# Patient Record
Sex: Female | Born: 1980 | Race: White | Hispanic: No | Smoking: Former smoker
Health system: Southern US, Community
[De-identification: ages and names within clinical notes are randomized; demographics above are authoritative.]

## PROBLEM LIST (undated history)

## (undated) DIAGNOSIS — M419 Scoliosis, unspecified: Secondary | ICD-10-CM

## (undated) DIAGNOSIS — M629 Disorder of muscle, unspecified: Secondary | ICD-10-CM

## (undated) DIAGNOSIS — I10 Essential (primary) hypertension: Secondary | ICD-10-CM

## (undated) DIAGNOSIS — F32A Depression, unspecified: Secondary | ICD-10-CM

## (undated) DIAGNOSIS — F4310 Post-traumatic stress disorder, unspecified: Secondary | ICD-10-CM

## (undated) DIAGNOSIS — Q780 Osteogenesis imperfecta: Secondary | ICD-10-CM

## (undated) DIAGNOSIS — M797 Fibromyalgia: Secondary | ICD-10-CM

## (undated) DIAGNOSIS — F419 Anxiety disorder, unspecified: Secondary | ICD-10-CM

## (undated) DIAGNOSIS — F329 Major depressive disorder, single episode, unspecified: Secondary | ICD-10-CM

## (undated) HISTORY — DX: Osteogenesis imperfecta: Q78.0

## (undated) HISTORY — PX: KNEE SURGERY: SHX244

## (undated) HISTORY — DX: Fibromyalgia: M79.7

## (undated) HISTORY — DX: Depression, unspecified: F32.A

## (undated) HISTORY — DX: Anxiety disorder, unspecified: F41.9

## (undated) HISTORY — DX: Major depressive disorder, single episode, unspecified: F32.9

## (undated) HISTORY — PX: NOSE SURGERY: SHX723

## (undated) HISTORY — PX: CHOLECYSTECTOMY: SHX55

## (undated) HISTORY — DX: Essential (primary) hypertension: I10

## (undated) HISTORY — DX: Scoliosis, unspecified: M41.9

## (undated) HISTORY — DX: Disorder of muscle, unspecified: M62.9

## (undated) HISTORY — DX: Post-traumatic stress disorder, unspecified: F43.10

## (undated) HISTORY — PX: NASAL SINUS SURGERY: SHX719

---

## 2003-07-06 ENCOUNTER — Other Ambulatory Visit: Payer: Self-pay

## 2005-09-04 ENCOUNTER — Observation Stay: Payer: Self-pay

## 2005-09-23 ENCOUNTER — Observation Stay: Payer: Self-pay

## 2005-10-05 ENCOUNTER — Observation Stay: Payer: Self-pay

## 2005-10-06 ENCOUNTER — Ambulatory Visit: Payer: Self-pay

## 2005-10-27 ENCOUNTER — Observation Stay: Payer: Self-pay

## 2005-11-12 ENCOUNTER — Observation Stay: Payer: Self-pay

## 2005-11-29 ENCOUNTER — Observation Stay: Payer: Self-pay | Admitting: Obstetrics and Gynecology

## 2005-12-08 ENCOUNTER — Inpatient Hospital Stay: Payer: Self-pay | Admitting: Unknown Physician Specialty

## 2006-12-07 ENCOUNTER — Ambulatory Visit: Payer: Self-pay | Admitting: Obstetrics & Gynecology

## 2006-12-14 ENCOUNTER — Ambulatory Visit: Payer: Self-pay | Admitting: Obstetrics & Gynecology

## 2010-02-05 ENCOUNTER — Emergency Department: Payer: Self-pay | Admitting: Emergency Medicine

## 2010-02-13 HISTORY — PX: ABDOMINAL HYSTERECTOMY: SHX81

## 2010-02-25 ENCOUNTER — Ambulatory Visit: Payer: Self-pay | Admitting: Obstetrics & Gynecology

## 2010-03-04 ENCOUNTER — Ambulatory Visit: Payer: Self-pay | Admitting: Obstetrics & Gynecology

## 2010-03-06 LAB — PATHOLOGY REPORT

## 2010-06-29 ENCOUNTER — Emergency Department: Payer: Self-pay | Admitting: Emergency Medicine

## 2010-11-06 ENCOUNTER — Emergency Department: Payer: Self-pay | Admitting: Emergency Medicine

## 2012-10-14 IMAGING — US US PELV - US TRANSVAGINAL
1 series · 17 of 25 positions shown · non-contrast
Comparison: none

REASON FOR EXAM: Pelvic pain for 1 [DATE] month with DUB
COMMENTS:   LMP: > one month ago

[Series 1: us pelv - us transvaginal · 17 of 71 slices shown]
[im 1/71]
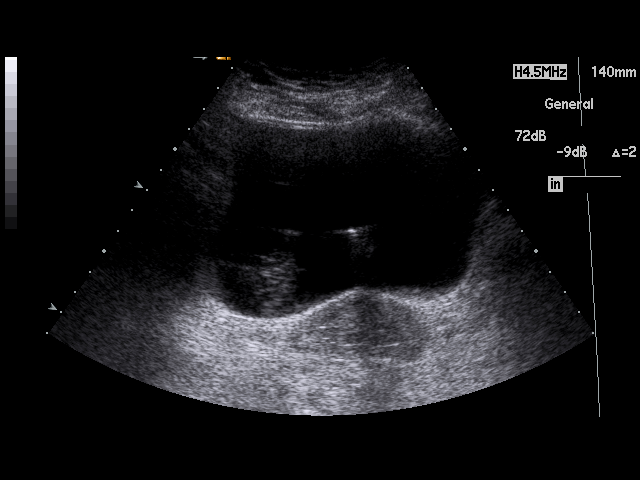
[im 6/71]
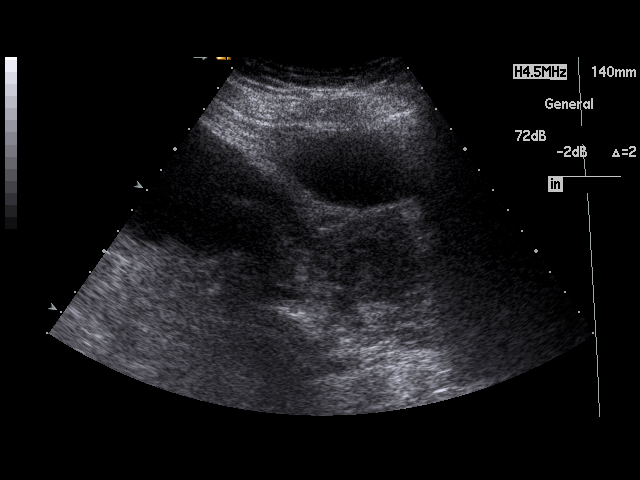
[im 9/71]
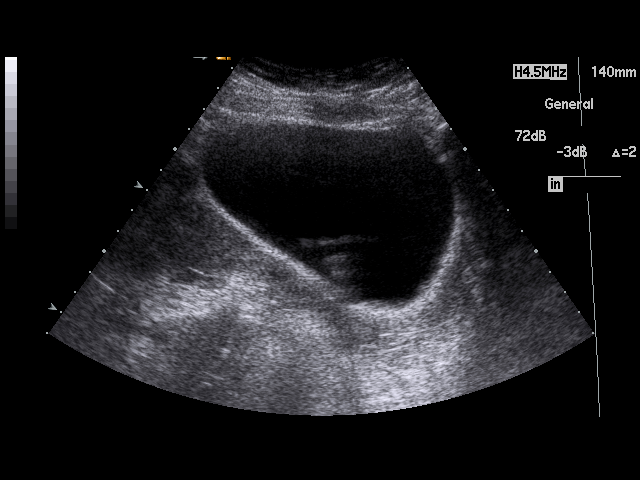
[im 15/71]
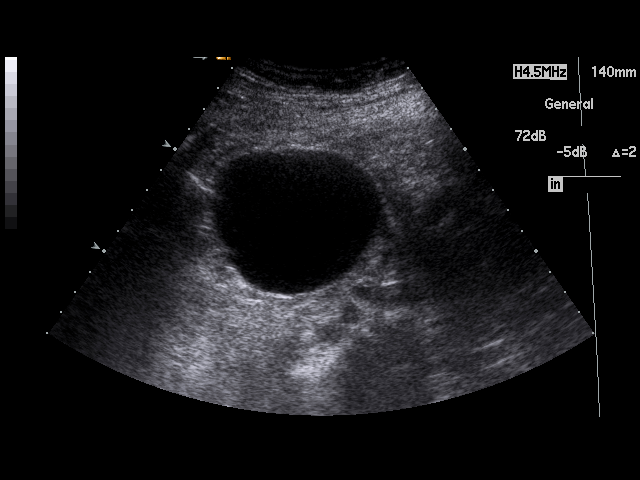
[im 18/71]
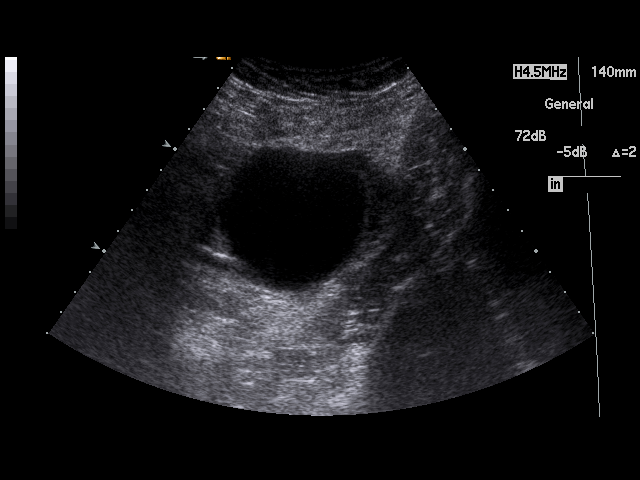
[im 24/71]
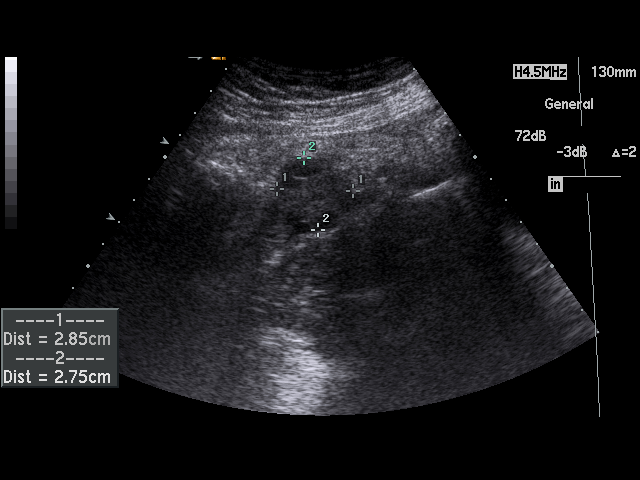
[im 27/71]
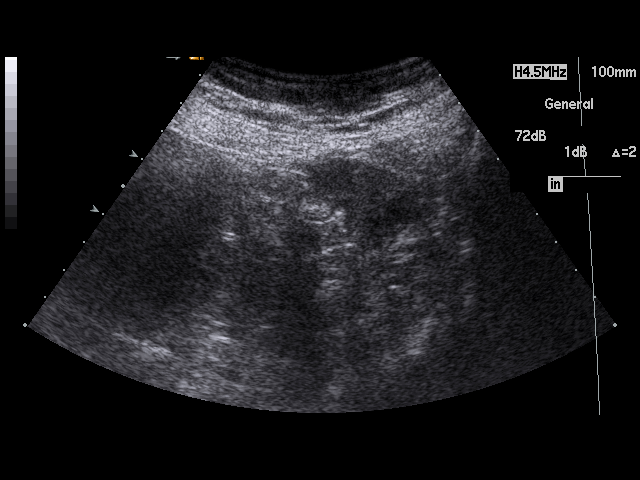
[im 33/71]
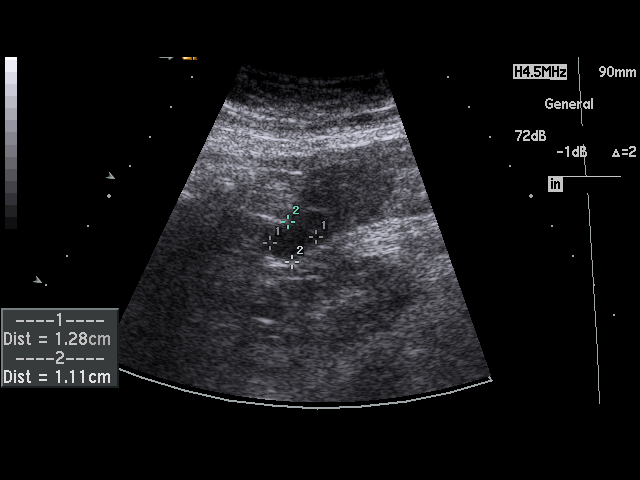
[im 36/71]
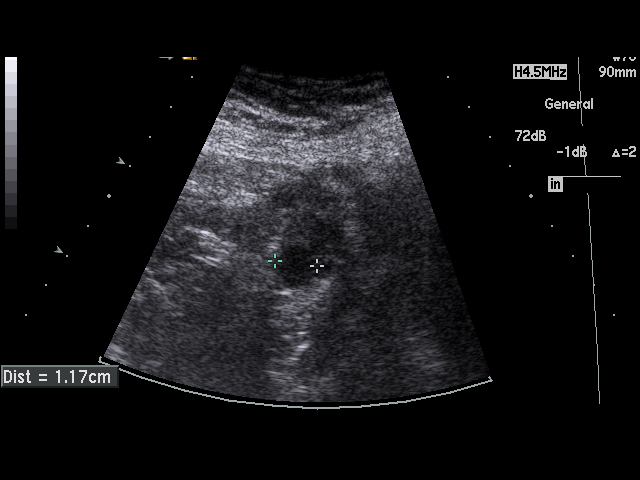
[im 38/71]
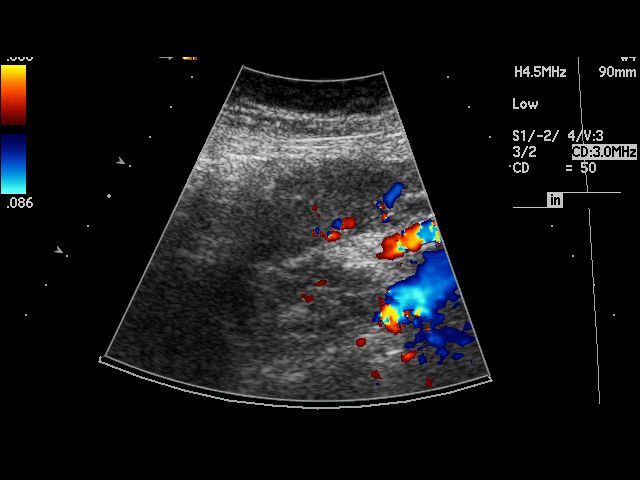
[im 44/71]
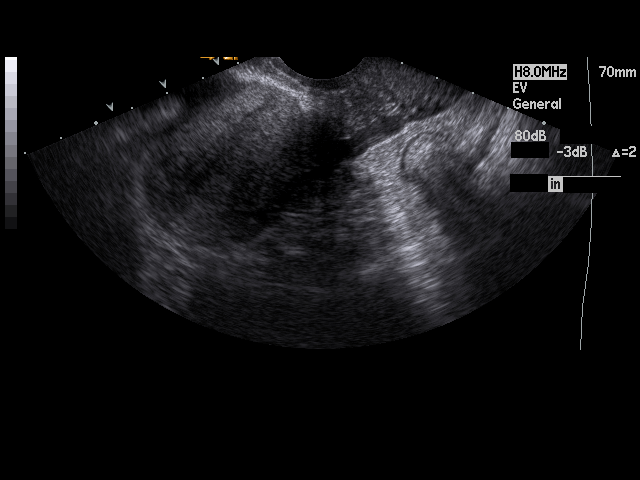
[im 47/71]
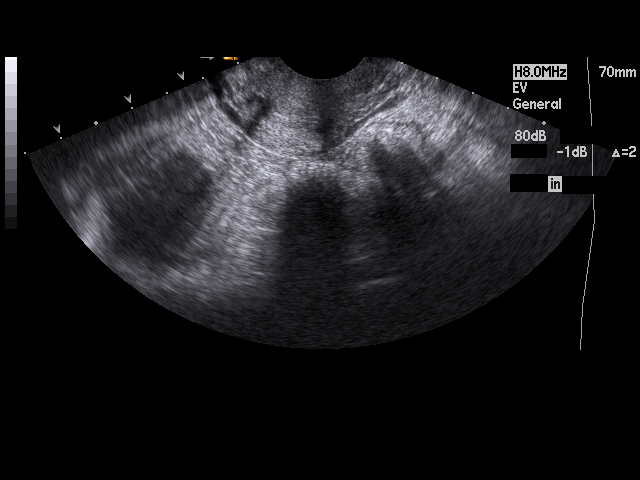
[im 53/71]
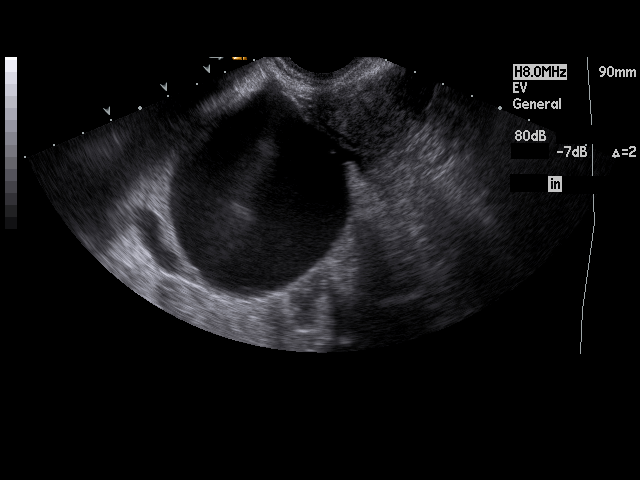
[im 56/71]
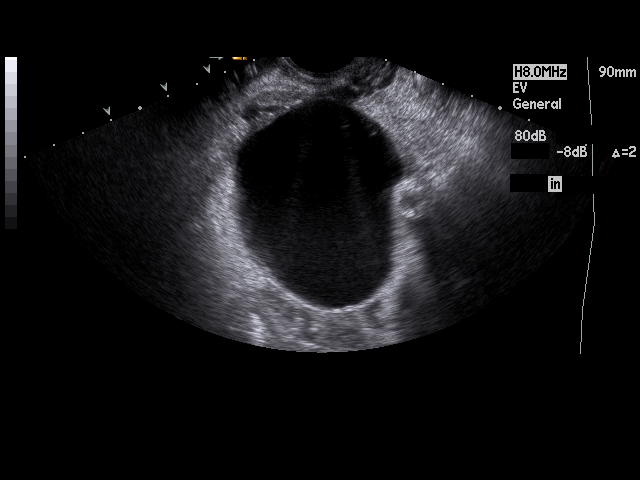
[im 62/71]
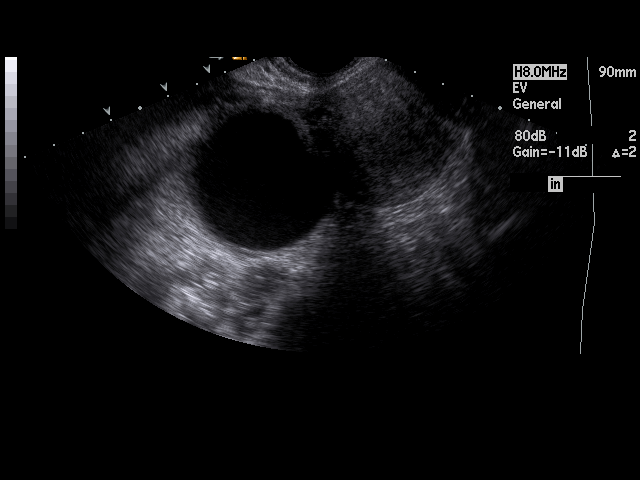
[im 65/71]
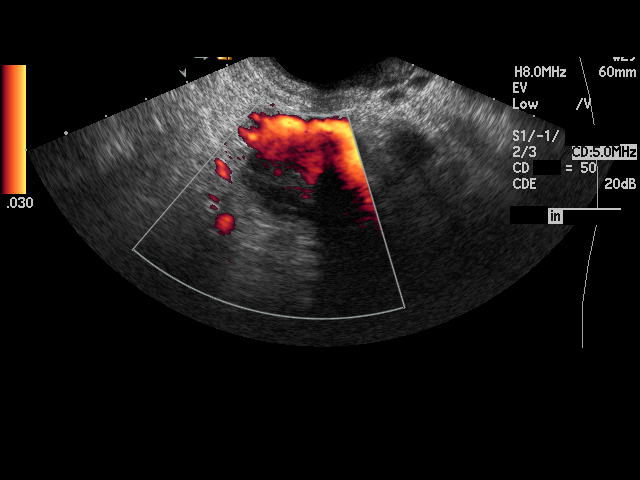
[im 71/71]
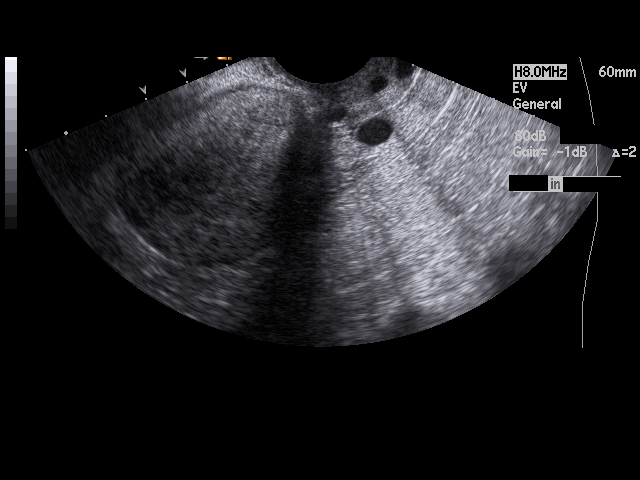

[17 of 25 positions shown; findings below may reference images not displayed]

PROCEDURE:     US  - US PELVIS EXAM W/TRANSVAGINAL  - February 05, 2010  [DATE]

RESULT:     Transabdominal and endovaginal ultrasound was performed. The
uterus measures 11.16 cm x 4.74 cm x 3.59 cm. No uterine mass lesions are
seen. The endometrium measures 5 mm in thickness. The right and left ovaries
are visualized. The right ovary measures 2.85 cm at maximum diameter and the
left ovary measures 4.1 cm at maximum diameter. There is a 6.88 cm exophytic
cyst of the right ovary. Multiple follicular cysts are seen in the left
ovary. No ascites is observed. The kidneys show no hydronephrosis.
IMPRESSION: 1. There is a 6.88 cm cyst of the right ovary.
2. No free fluid is seen in the pelvis.
3. No uterine mass lesions are noted.

## 2013-05-23 ENCOUNTER — Ambulatory Visit: Payer: Self-pay | Admitting: Otolaryngology

## 2013-07-15 IMAGING — CT CT HEAD WITHOUT CONTRAST
2 series · 16 of 30 positions shown, 20 images · non-contrast
Comparison: none

REASON FOR EXAM: left side h/a with right sided blurred vision today
Flex 5
COMMENTS:   LMP: Post Hysterectomy

PROCEDURE:     CT  - CT HEAD WITHOUT CONTRAST  - November 06, 2010  [DATE]
RESULT:     Comparison:  None
TECHNIQUE: Multiple axial images from the foramen magnum to the vertex were
obtained without IV contrast.

[Series 2: without · axial · non-contrast · 0.41mm/px · z∈[+29,+149]mm · 13 of 30 slices shown, 17 images]
[im 3/30  brain]
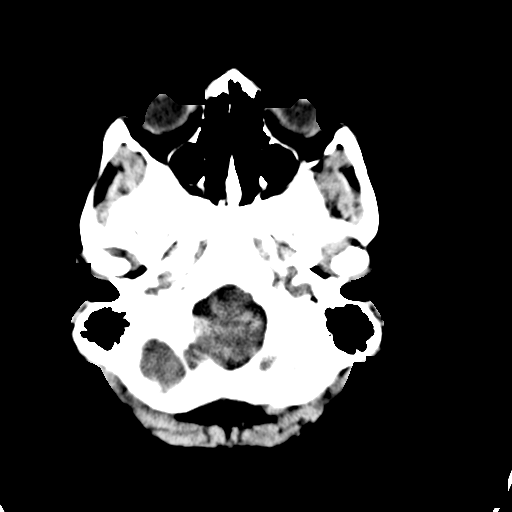
[im 3/30  bone]
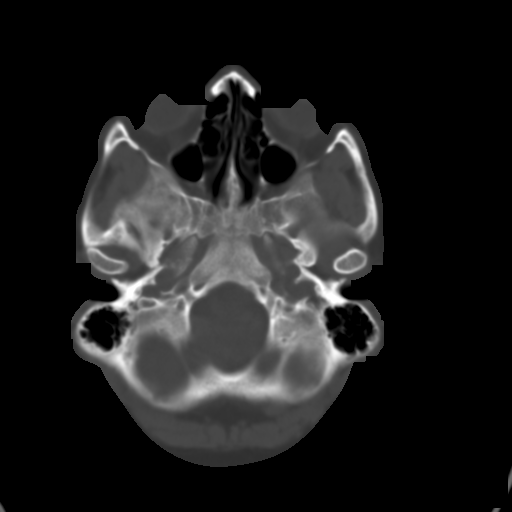
[im 5/30  brain]
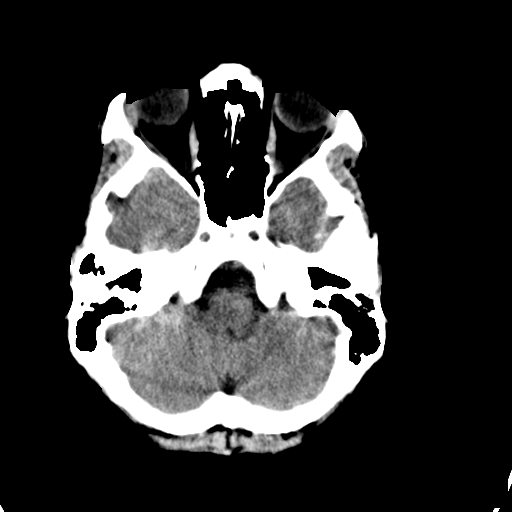
[im 7/30  brain]
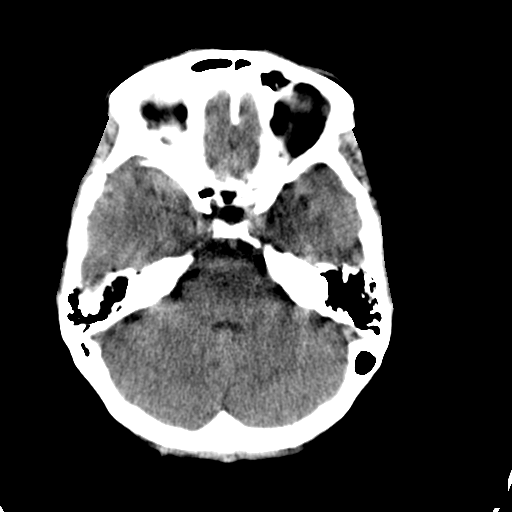
[im 9/30  brain]
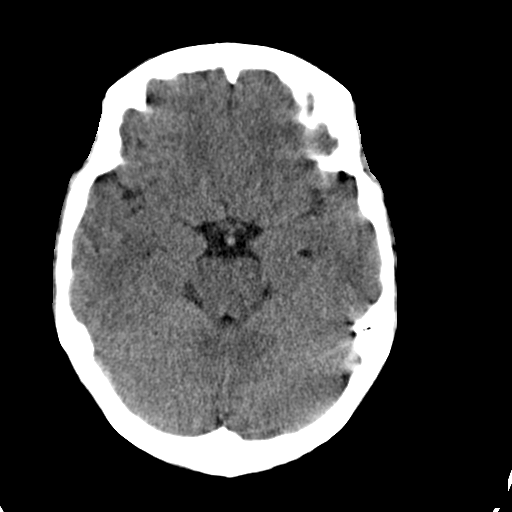
[im 11/30  brain]
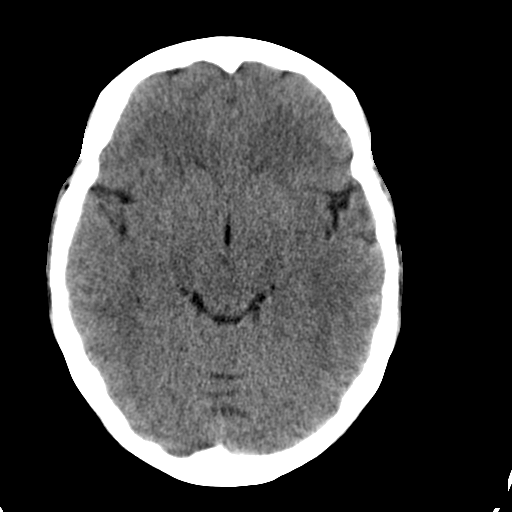
[im 11/30  bone]
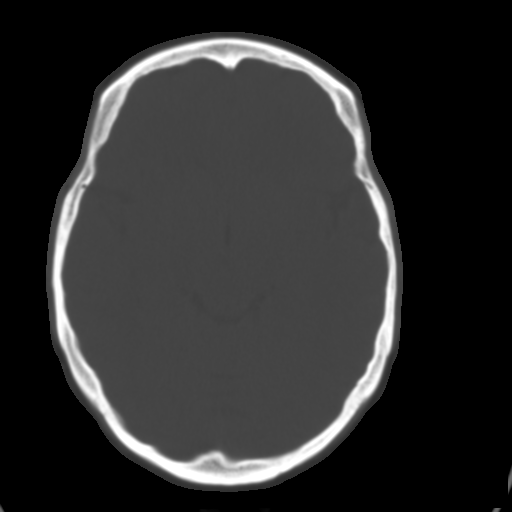
[im 13/30  brain]
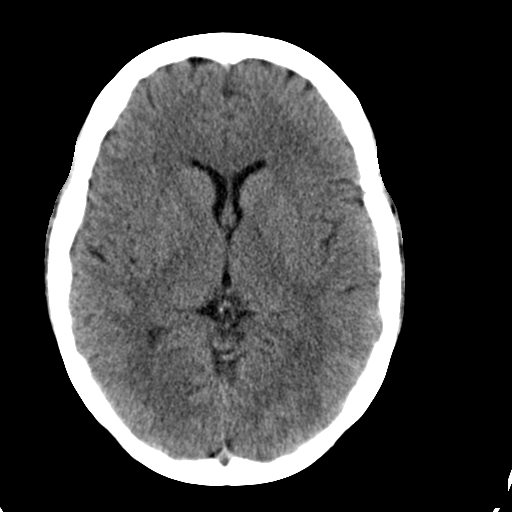
[im 15/30  brain]
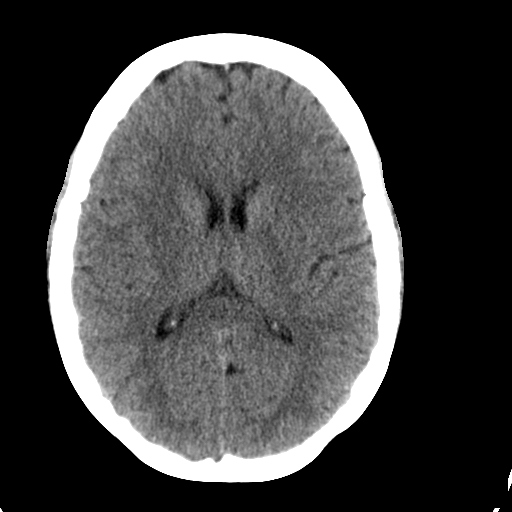
[im 17/30  brain]
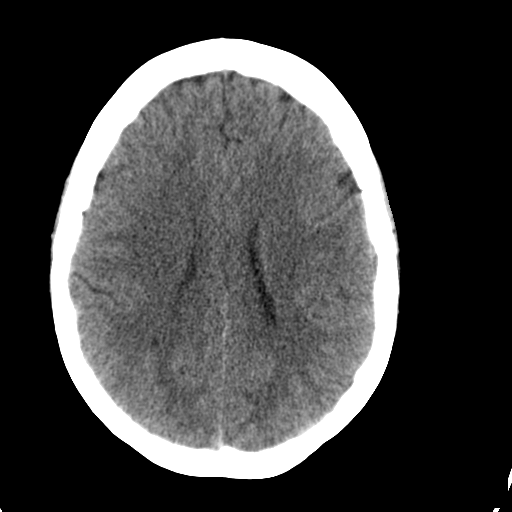
[im 19/30  brain]
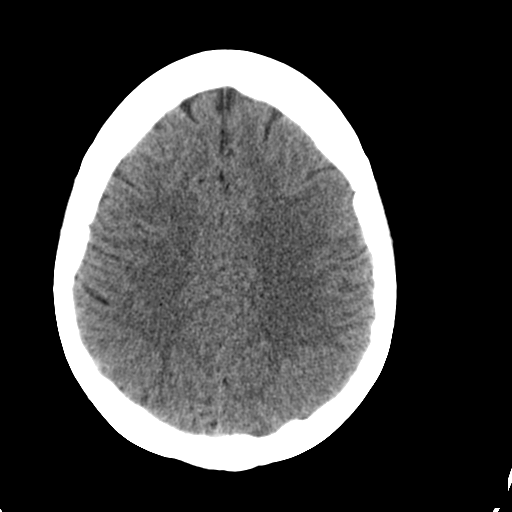
[im 19/30  bone]
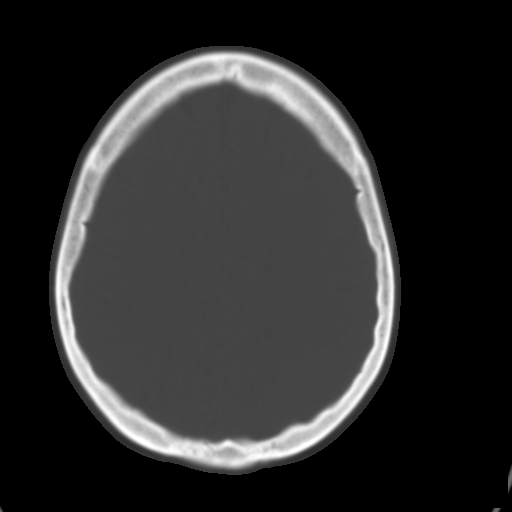
[im 21/30  brain]
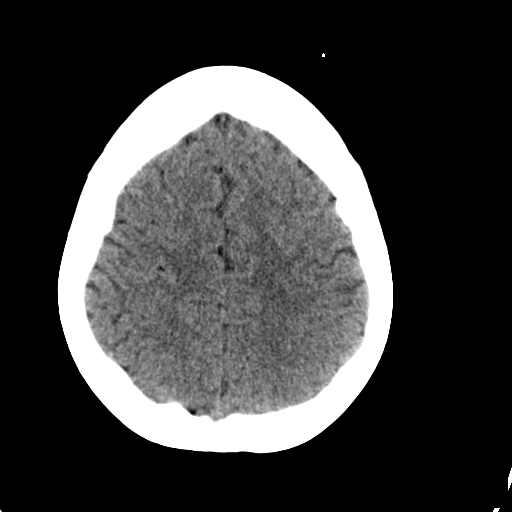
[im 23/30  brain]
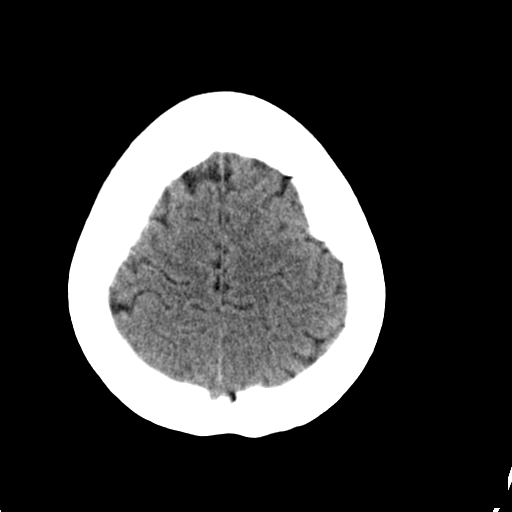
[im 25/30  brain]
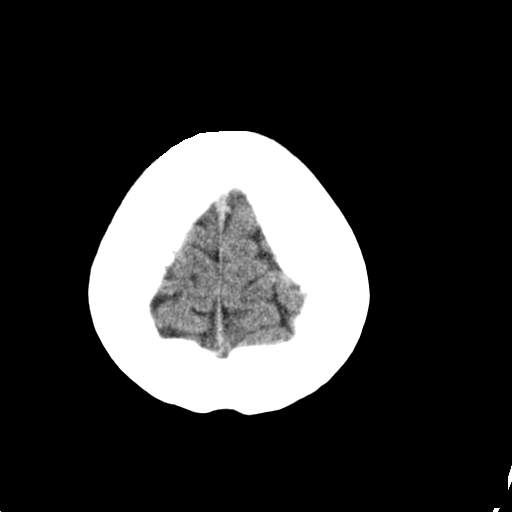
[im 27/30  brain]
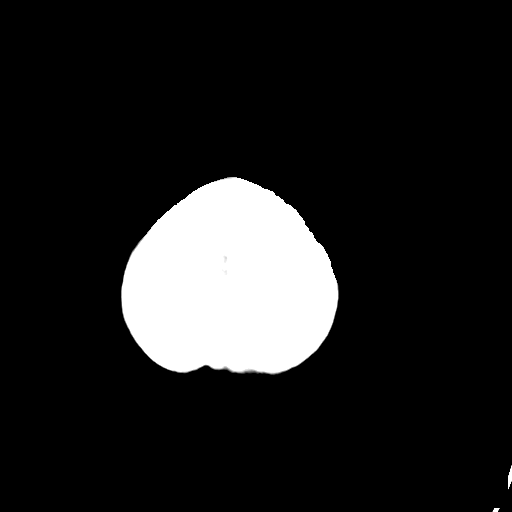
[im 27/30  bone]
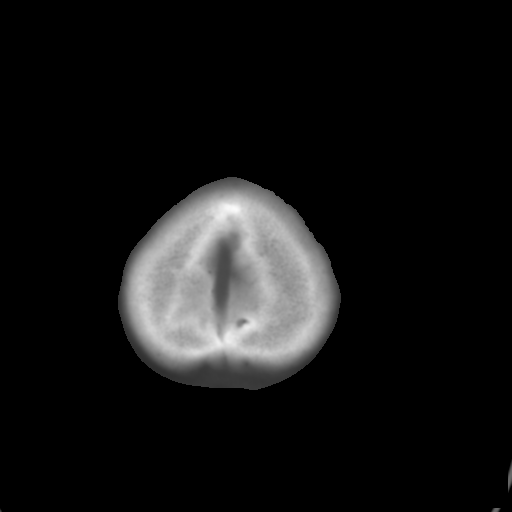

[Series 3: bone · axial · 0.41mm/px · z∈[+29,+69]mm · 3 of 30 slices shown]
[im 3/30  bone]
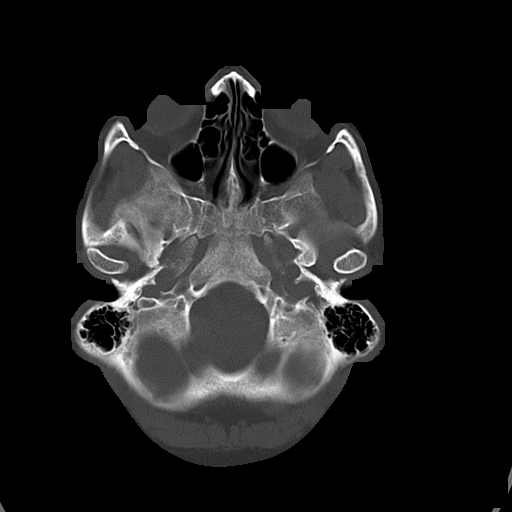
[im 7/30  bone]
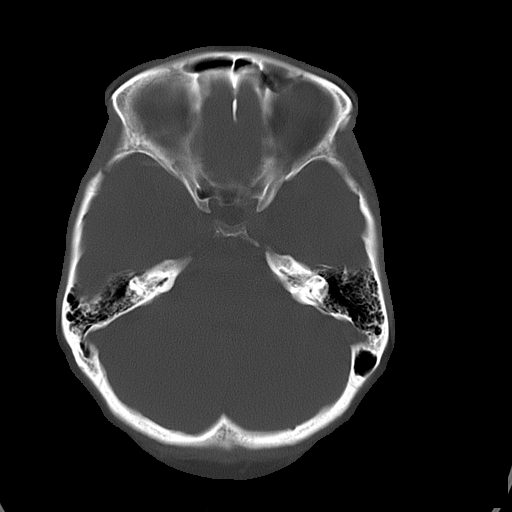
[im 11/30  bone]
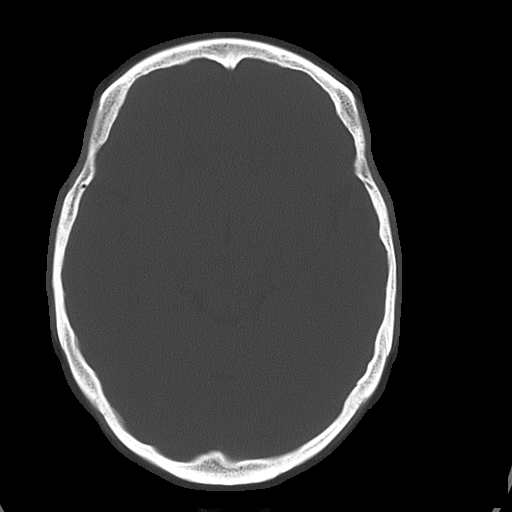

[16 of 30 positions shown; findings below may reference images not displayed]

FINDINGS: There is no evidence of mass effect, midline shift, or extra-axial fluid
collections.  There is no evidence of a space-occupying lesion or
intracranial hemorrhage. There is no evidence of a cortical-based area of
acute infarction.

The ventricles and sulci are appropriate for the patient's age. The basal
cisterns are patent.

Visualized portions of the orbits are unremarkable. The visualized portions
of the paranasal sinuses and mastoid air cells are unremarkable.

The osseous structures are unremarkable.
IMPRESSION: No acute intracranial process.

## 2014-07-07 NOTE — Op Note (Signed)
PATIENT NAME:  Rebecca Farrell, Rebecca Farrell MR#:  [id number] DATE OF BIRTH:  [birth year]  DATE OF PROCEDURE:  05/23/2013  PREOPERATIVE DIAGNOSES:  1.  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  2.  Chronic left maxillary sinusitis.   POSTOPERATIVE DIAGNOSES:  1.  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  2.  Chronic left maxillary sinusitis.   PROCEDURES: 1.  Left endoscopic maxillary antrostomy with tissue removal.  2.  Right endoscopic concha bullosa resection.  3.  Nasal septoplasty.  4.  Bilateral submucous resection of the inferior turbinates.   SURGEON: Marion DownerScott Tilden Broz, MD   ANESTHESIA: General endotracheal.   INDICATIONS: The patient with a history of chronic nasal obstruction as well as chronic left maxillary sinusitis.   FINDINGS: There was mucosal thickening and mucoid drainage coming from the infundibulum of the left maxillary sinus. The turbinates were substantially hypertrophic and there was a right concha bullosa obstructing the right nasal cavity. The nasal septum deviated to the left.   COMPLICATIONS: None.   DESCRIPTION OF PROCEDURE: After obtaining informed consent, the patient was taken to the operating room and placed in the supine position. After induction of general endotracheal anesthesia, the patient was turned 90 degrees. The nose was decongested with Afrin moistened pledgets. 1% lidocaine with epinephrine 1:100,000 was injected into either side of the nasal septum, the inferior turbinates, and the middle meatus bilaterally. She was then prepped and draped in the usual sterile fashion. The left nasal cavity was inspected endoscopically. The middle turbinate was medialized and the uncinate process was subsequently resected using a combination of through-cutting forceps, the pediatric backbiter, and the microdebrider. Soft tissue and bone were removed to create a large patent maxillary  antrostomy joining up the primary os with the secondary os. Attention was then turned to the right side. A sickle knife was used to incise the concha bullosa. The lateral wall of the concha bullosa was then resected with endoscopic scissors. The medial wall of the concha bullosa that remained was barely attached to the remainder of the middle turbinate and it was felt best to go ahead and resect that residual medial wall. Next, the nasal septum was incised on the left along its caudal aspect and the mucoperichondrial flap was elevated on the left side of the nasal septum. This was elevated back over the bony cartilaginous junction. The bony septum was noted to be deviated towards the left. This was separated from the cartilaginous septum and the deviated bony septum resected with Knight scissors. This allowed the cartilaginous septum to relax back into the midline straightening the nasal septum nicely. The incision was closed with 4-0 chromic gut suture. The mucoperichondrial flap was reapproximated to the underlying cartilage with a 4-0 plain gut suture in a running quilting type stitch. Next, the left inferior turbinate was incised along its caudal border with a 15 blade. The mucoperiosteum was elevated off of the medial aspect of the turbinate. A portion of the concha and lateral mucoperiosteum was then resected with Knight scissors. The cut edge of the turbinate was cauterized with suction cautery to control bleeding. The same procedure was then performed on the right inferior turbinate. The nose was suctioned to remove any blood clot. Stammberger absorbable sinus packing was placed in the middle meatus bilaterally to control minor bleeding. Next, septal splints were placed on either side and secured with a 4-0 Ethilon suture. The patient was then returned to the anesthesiologist for awakening. She  was awakened and taken to the recovery room in good condition postoperatively. Blood loss was less than 50  mL. ____________________________ Ollen Gross. Willeen Cass, MD psb:sb D: 05/23/2013 11:53:54 ET T: 05/23/2013 12:05:06 ET JOB#: [id number]  cc: Ollen Gross. Willeen Cass, MD, <Dictator> Sandi Mealy MD ELECTRONICALLY SIGNED 05/24/2013 13:48

## 2017-01-29 ENCOUNTER — Encounter: Payer: Self-pay | Admitting: Obstetrics and Gynecology

## 2017-01-29 ENCOUNTER — Ambulatory Visit (INDEPENDENT_AMBULATORY_CARE_PROVIDER_SITE_OTHER): Payer: Medicare Other | Admitting: Obstetrics and Gynecology

## 2017-01-29 VITALS — BP 128/86 | HR 92 | Ht 61.0 in | Wt 158.0 lb

## 2017-01-29 DIAGNOSIS — Z0000 Encounter for general adult medical examination without abnormal findings: Secondary | ICD-10-CM | POA: Diagnosis not present

## 2017-01-29 DIAGNOSIS — Z90711 Acquired absence of uterus with remaining cervical stump: Secondary | ICD-10-CM

## 2017-01-29 DIAGNOSIS — N760 Acute vaginitis: Secondary | ICD-10-CM

## 2017-01-29 DIAGNOSIS — R8290 Unspecified abnormal findings in urine: Secondary | ICD-10-CM

## 2017-01-29 LAB — POCT URINALYSIS DIPSTICK
BILIRUBIN UA: NEGATIVE
Glucose, UA: NEGATIVE
KETONES UA: NEGATIVE
Leukocytes, UA: NEGATIVE
NITRITE UA: NEGATIVE
PH UA: 6 (ref 5.0–8.0)
PROTEIN UA: NEGATIVE
RBC UA: NEGATIVE
Spec Grav, UA: 1.025 (ref 1.010–1.025)
Urobilinogen, UA: 0.2 E.U./dL

## 2017-01-30 ENCOUNTER — Encounter: Payer: Self-pay | Admitting: Obstetrics and Gynecology

## 2017-01-30 DIAGNOSIS — Z90711 Acquired absence of uterus with remaining cervical stump: Secondary | ICD-10-CM | POA: Insufficient documentation

## 2017-01-30 MED ORDER — METRONIDAZOLE 500 MG PO TABS: 500.0000 mg | ORAL_TABLET | Freq: Two times a day (BID) | ORAL | 0 refills | Status: AC

## 2017-01-30 NOTE — Progress Notes (Signed)
Chief Complaint: Abnormal vaginal discharge  HPI:      Ms. Rebecca Farrell is a 36 y.o. [id number] who LMP was No LMP recorded (exact date). Patient has had a hysterectomy., presents today for a problem visit.  She complains of:  Vaginitis: Patient complains of an abnormal vaginal discharge for 5 week. Vaginal symptoms include discharge described as white, malodorous and milky, local irritation and odor.Vulvar symptoms include local irritation.STI Risk: Very low risk of STD exposureDischarge described as: copious, white and watery.Other associated symptoms: none.  Patient also had questions regarding the possibility of her and her new partner having a child. She is engaged and her fiance is interested in having a child with her. She had a super cervical hysterectomy several years ago, but retains her ovaries.   Patient was not aware that her cervix remained, it was seen on exam. She has not had cervical cancer screening in several years. Performed today.  PMHx: She  has a past medical history of Anxiety, Depression, Disorder of skeletal muscle, Fibromyalgia, Hypertension, Osteogenesis imperfecta, PTSD (post-traumatic stress disorder), and Scoliosis. Also,  has a past surgical history that includes Abdominal hysterectomy (02/2010); Knee surgery (Bilateral); Cesarean section (x2); Nose surgery; Nasal sinus surgery; and Cholecystectomy., family history includes Endometriosis in her maternal aunt; Other in her daughter, father, and son.,  reports that she has been smoking cigarettes.  she has never used smokeless tobacco. She reports that she does not drink alcohol or use drugs.  She has a current medication list which includes the following prescription(s): diazepam, duloxetine, fluticasone, lisinopril-hydrochlorothiazide, meloxicam, methocarbamol, omeprazole, and metronidazole. Also, has No Known Allergies.  Review of Systems  Constitutional: Negative for chills, fever, malaise/fatigue and weight  loss.  HENT: Negative for congestion, hearing loss and sinus pain.   Eyes: Negative for blurred vision and double vision.  Respiratory: Negative for cough, sputum production, shortness of breath and wheezing.   Cardiovascular: Negative for chest pain, palpitations, orthopnea and leg swelling.  Gastrointestinal: Negative for abdominal pain, constipation, diarrhea, nausea and vomiting.  Genitourinary: Negative for dysuria, flank pain, frequency, hematuria and urgency.  Musculoskeletal: Negative for back pain, falls and joint pain.  Skin: Negative for itching and rash.  Neurological: Negative for dizziness and headaches.  Psychiatric/Behavioral: Negative for depression, substance abuse and suicidal ideas. The patient is not nervous/anxious.     Objective: BP 128/86   Pulse 92   Ht 5\' 1"  (1.549 m)   Wt 158 lb (71.7 kg)   LMP  (Exact Date)   BMI 29.85 kg/m  Physical Exam  Constitutional: She is oriented to person, place, and time. She appears well-developed.  Genitourinary: There is no lesion on the right labia. There is no lesion on the left labia. Vagina exhibits no lesion. There is tenderness in the vagina. No erythema or bleeding in the vagina. No foreign body in the vagina. Vaginal discharge found. Right adnexum does not display mass. Left adnexum does not display mass. Cervix does not exhibit motion tenderness or lesion.  Genitourinary Comments: Uterus is surgically absent. Cervix is still present.   HENT:  Head: Normocephalic and atraumatic.  Eyes: EOM are normal.  Neck: Neck supple. No thyromegaly present.  Cardiovascular: Normal rate, regular rhythm and normal heart sounds.  Pulmonary/Chest: Effort normal and breath sounds normal. Right breast exhibits no inverted nipple, no mass, no nipple discharge and no skin change. Left breast exhibits no inverted nipple, no mass, no nipple discharge and no skin change.  Abdominal: Soft. Bowel sounds  are normal. She exhibits no distension and  no mass.  Neurological: She is alert and oriented to person, place, and time.  Skin: Skin is warm and dry.  Psychiatric: She has a normal mood and affect. Her behavior is normal. Judgment and thought content normal.  Vitals reviewed.   ASSESSMENT/PLAN:    Problem List Items Addressed This Visit    None    Visit Diagnoses    Acute vaginitis    -  Primary   Relevant Orders   NuSwab Vaginitis Plus (VG+)   PapIG, CtNgTv, HPV, rfx 16/18   Bad odor of urine       Relevant Orders   POCT Urinalysis Dipstick (Completed)   Health care maintenance       Relevant Orders   PapIG, CtNgTv, HPV, rfx 16/18     Discussed with patient that in order for her to conceive a child with her partner they would need to use a surrogate. Did discuss with the patient that there are experimental uterine transplants, but that this was not a common practice procedure. Could refer the patient to [facility], but she does not want to pursue that at this time.    Rebecca Farrell

## 2017-02-02 LAB — NUSWAB VAGINITIS PLUS (VG+)
Atopobium vaginae: HIGH Score — AB
BVAB 2: HIGH Score — AB
CANDIDA ALBICANS, NAA: NEGATIVE
CHLAMYDIA TRACHOMATIS, NAA: NEGATIVE
Candida glabrata, NAA: NEGATIVE
MEGASPHAERA 1: HIGH {score} — AB
Neisseria gonorrhoeae, NAA: NEGATIVE
TRICH VAG BY NAA: NEGATIVE

## 2017-02-03 ENCOUNTER — Telehealth: Payer: Self-pay | Admitting: Obstetrics and Gynecology

## 2017-02-03 LAB — PAPIG, CTNGTV, HPV, RFX 16/18
CHLAMYDIA, NUC. ACID AMP: NEGATIVE
GONOCOCCUS, NUC. ACID AMP: NEGATIVE
HPV GENOTYPE, 16: POSITIVE — AB
HPV Genotype, 18: NEGATIVE
HPV, HIGH-RISK: POSITIVE — AB
PAP SMEAR COMMENT: 0
TRICH VAG BY NAA: NEGATIVE

## 2017-02-03 NOTE — Telephone Encounter (Signed)
Please advise, DR Jerene PitchSchuman not in office today

## 2017-02-03 NOTE — Telephone Encounter (Signed)
Pt is calling needing here Labs results. Please advise

## 2017-02-09 NOTE — Progress Notes (Signed)
Patient needs a colposcopy. Called and left message for her to return my call.

## 2017-02-10 ENCOUNTER — Other Ambulatory Visit: Payer: Self-pay | Admitting: Obstetrics and Gynecology

## 2017-02-10 ENCOUNTER — Telehealth: Payer: Self-pay | Admitting: Obstetrics and Gynecology

## 2017-02-10 DIAGNOSIS — B9689 Other specified bacterial agents as the cause of diseases classified elsewhere: Secondary | ICD-10-CM

## 2017-02-10 DIAGNOSIS — N760 Acute vaginitis: Principal | ICD-10-CM

## 2017-02-10 MED ORDER — CLINDAMYCIN PHOSPHATE 100 MG VA SUPP: 100.0000 mg | Freq: Every day | VAGINAL | 0 refills | Status: AC

## 2017-02-10 NOTE — Telephone Encounter (Signed)
Called and left voice mail for patient to call back to be schedule °

## 2017-02-10 NOTE — Telephone Encounter (Signed)
Pt is calling needing to speak with dr Jerene PitchSchuman about a prescription she was prescribed   and had an allergic reaction that has sent her to the hospital. Please call patient CB# [phone]

## 2017-02-10 NOTE — Telephone Encounter (Signed)
I called her and left a message. Will try again

## 2017-02-10 NOTE — Telephone Encounter (Signed)
-----   Message from Natale Milchhristanna R Schuman, MD sent at 02/10/2017 11:49 AM EST ----- Please call patient and schedule for colposcopy. Thank you.

## 2017-02-16 NOTE — Progress Notes (Signed)
Thank you Rosanne SackKasey!

## 2017-02-24 ENCOUNTER — Ambulatory Visit (INDEPENDENT_AMBULATORY_CARE_PROVIDER_SITE_OTHER): Payer: Medicare Other | Admitting: Obstetrics and Gynecology

## 2017-02-24 ENCOUNTER — Encounter: Payer: Self-pay | Admitting: Obstetrics and Gynecology

## 2017-02-24 VITALS — BP 132/84 | HR 97 | Ht 61.0 in | Wt 157.0 lb

## 2017-02-24 DIAGNOSIS — R87810 Cervical high risk human papillomavirus (HPV) DNA test positive: Secondary | ICD-10-CM

## 2017-02-24 DIAGNOSIS — N760 Acute vaginitis: Secondary | ICD-10-CM | POA: Diagnosis not present

## 2017-02-24 DIAGNOSIS — B9689 Other specified bacterial agents as the cause of diseases classified elsewhere: Secondary | ICD-10-CM | POA: Diagnosis not present

## 2017-02-24 MED ORDER — CLINDAMYCIN HCL 300 MG PO CAPS: 300.0000 mg | ORAL_CAPSULE | Freq: Two times a day (BID) | ORAL | 0 refills | Status: AC

## 2017-02-24 NOTE — Progress Notes (Signed)
Colposcopy Procedure Note  Indications: Pap smear 1 months ago showed: NIL, HPV 16 Positive. The prior pap was normal.  Prior cervical/vaginal disease: normal exam without visible pathology. Prior cervical treatment: no treatment.  Procedure Details  The risks and benefits of the procedure and Written informed consent obtained.  Speculum placed in vagina and excellent visualization of cervix achieved, cervix swabbed x 3 with acetic acid solution.  Findings: Cervix:Transformation zone not fully visualized. Small mosaic changes at 5'oclock and along lower cervical border. Visible lesion(s) at 5 o'clock; endocervical curettage performed, cervical biopsies taken at 5 o'clock, specimen labelled and sent to pathology and hemostasis achieved with silver nitrate. Vaginal inspection: vaginal colposcopy not performed. Vulvar colposcopy: vulvar colposcopy not performed.  Specimens: Ectocervical biopsy 5 o'clock and ECC  Complications: none.  Plan: Specimens labelled and sent to Pathology.  Bacterial Vaginosis: Flagyl allergy. Patient has not been able to fill her prescription for clindamycin ovules. Sent prescription for oral therapy.   -Dorinne Graeff MD 02/25/17 9:26 AM

## 2017-02-25 LAB — PATHOLOGY

## 2017-03-01 NOTE — Progress Notes (Signed)
Called and discussed result and plan with patient. She will need a repeat pap smear with cotesting in 1 year.

## 2019-02-07 ENCOUNTER — Other Ambulatory Visit: Payer: Self-pay

## 2019-02-07 DIAGNOSIS — Z20822 Contact with and (suspected) exposure to covid-19: Secondary | ICD-10-CM

## 2019-02-08 LAB — NOVEL CORONAVIRUS, NAA: SARS-CoV-2, NAA: NOT DETECTED

## 2019-02-13 ENCOUNTER — Telehealth: Payer: Medicare Other | Admitting: Physician Assistant

## 2019-02-13 DIAGNOSIS — J329 Chronic sinusitis, unspecified: Secondary | ICD-10-CM

## 2019-02-13 MED ORDER — AMOXICILLIN-POT CLAVULANATE 875-125 MG PO TABS: 1.0000 | ORAL_TABLET | Freq: Two times a day (BID) | ORAL | 0 refills | Status: DC

## 2019-02-13 NOTE — Progress Notes (Signed)

## 2021-06-23 ENCOUNTER — Encounter: Payer: Self-pay | Admitting: Otolaryngology

## 2021-06-27 NOTE — Discharge Instructions (Signed)
?[facility] ?[facility] ?ENDOSCOPIC SINUS SURGERY ?[facility] ? ?What is Functional Endoscopic Sinus Surgery? ? The Surgery involves making the natural openings of the sinuses larger by removing the bony partitions that separate the sinuses from the nasal cavity.  The natural sinus lining is preserved as much as possible to allow the sinuses to resume normal function after the surgery.  In some patients nasal polyps (excessively swollen lining of the sinuses) may be removed to relieve obstruction of the sinus openings.  The surgery is performed through the nose using lighted scopes, which eliminates the need for incisions on the face.  A septoplasty is a different procedure which is sometimes performed with sinus surgery.  It involves straightening the boy partition that separates the two sides of your nose.  A crooked or deviated septum may need repair if is obstructing the sinuses or nasal airflow.  Turbinate reduction is also often performed during sinus surgery.  The turbinates are bony proturberances from the side walls of the nose which swell and can obstruct the nose in patients with sinus and allergy problems.  Their size can be surgically reduced to help relieve nasal obstruction. ? ?What Can Sinus Surgery Do For Me? ? Sinus surgery can reduce the frequency of sinus infections requiring antibiotic treatment.  This can provide improvement in nasal congestion, post-nasal drainage, facial pressure and nasal obstruction.  Surgery will NOT prevent you from ever having an infection again, so it usually only for patients who get infections 4 or more times yearly requiring antibiotics, or for infections that do not clear with antibiotics.  It will not cure nasal allergies, so patients with allergies may still require medication to treat their allergies after surgery. Surgery may improve headaches related to sinusitis, however, some people will continue to  require medication to control sinus headaches related to allergies.  Surgery will do nothing for other forms of headache (migraine, tension or cluster). ? ?What Are the Risks of Endoscopic Sinus Surgery? ? Current techniques allow surgery to be performed safely with little risk, however, there are rare complications that patients should be aware of.  Because the sinuses are located around the eyes, there is risk of eye injury, including blindness, though again, this would be quite rare. This is usually a result of bleeding behind the eye during surgery, which can effect vision, though there are treatments to protect the vision and prevent permanent injury. More serious complications would include bleeding inside the brain cavity or damage to the brain.This happens when the fluid around the brain leaks out into the sinus cavity.  Again, all of these complications are uncommon, and spinal fluid leaks can be safely managed surgically if they occur.  The most common complication of sinus surgery is bleeding from the nose, which may require packing or cauterization of the nose.  Patients with polyps may experience recurrence of the polyps that would require revision surgery.  Alterations of sense of smell or injury to the tear ducts are also rare complications.  ? ?What is the Surgery Like, and what is the Recovery? ? The Surgery usually takes a couple of hours to perform, and is usually performed under a general anesthetic (completely asleep).  Patients are usually discharged home after a couple of hours.  Sometimes during surgery it is necessary to pack the nose to control bleeding, and the packing is left in place for 24 - 48 hours, and removed by your surgeon.  If   a septoplasty was performed during the procedure, there is often a splint placed which must be removed after 5-7 days.   ?Discomfort: Pain is usually mild to moderate, and can be controlled by prescription pain medication or acetaminophen (Tylenol).   Aspirin, Ibuprofen (Advil, Motrin), or Naprosyn (Aleve) should be avoided, as they can cause increased bleeding.  Most patients feel sinus pressure like they have a bad head cold for several days.  Sleeping with your head elevated can help reduce swelling and facial pressure, as can ice packs over the face.  A humidifier may be helpful to keep the mucous and blood from drying in the nose.  ? ?Diet: There are no specific diet restrictions, however, you should generally start with clear liquids and a light diet of bland foods because the anesthetic can cause some nausea.  Advance your diet depending on how your stomach feels.  Taking your pain medication with food will often help reduce stomach upset which pain medications can cause. ? ?Nasal Saline Irrigation: It is important to remove blood clots and dried mucous from the nose as it is healing.  This is done by having you irrigate the nose at least 3 - 4 times daily with a salt water solution.  We recommend using NeilMed Sinus Rinse (available at the drug store).  Fill the squeeze bottle with the solution, bend over a sink, and insert the tip of the squeeze bottle into the nose ? of an inch.  Point the tip of the squeeze bottle towards the inside corner of the eye on the same side your irrigating.  Squeeze the bottle and gently irrigate the nose.  If you bend forward as you do this, most of the fluid will flow back out of the nose, instead of down your throat.   The solution should be warm, near body temperature, when you irrigate.   Each time you irrigate, you should use a full squeeze bottle.  ? ?Note that if you are instructed to use Nasal Steroid Sprays at any time after your surgery, irrigate with saline BEFORE using the steroid spray, so you do not wash it all out of the nose. ?Another product, Nasal Saline Gel (such as AYR Nasal Saline Gel) can be applied in each nostril 3 - 4 times daily to moisture the nose and reduce scabbing or crusting. ? ?Bleeding:   Bloody drainage from the nose can be expected for several days, and patients are instructed to irrigate their nose frequently with salt water to help remove mucous and blood clots.  The drainage may be dark red or brown, though some fresh blood may be seen intermittently, especially after irrigation.  Do not blow you nose, as bleeding may occur. If you must sneeze, keep your mouth open to allow air to escape through your mouth. ? ?If heavy bleeding occurs: Irrigate the nose with saline to rinse out clots, then spray the nose 3 - 4 times with Afrin Nasal Decongestant Spray.  The spray will constrict the blood vessels to slow bleeding.  Pinch the lower half of your nose shut to apply pressure, and lay down with your head elevated.  Ice packs over the nose may help as well. If bleeding persists despite these measures, you should notify your doctor.  Do not use the Afrin routinely to control nasal congestion after surgery, as it can result in worsening congestion and may affect healing.  ? ? ? ?Activity: Return to work varies among patients. Most patients will be out   of work at least 5 - 7 days to recover.  Patient may return to work after they are off of narcotic pain medication, and feeling well enough to perform the functions of their job.  Patients must avoid heavy lifting (over 10 pounds) or strenuous physical for 2 weeks after surgery, so your employer may need to assign you to light duty, or keep you out of work longer if light duty is not possible.  NOTE: you should not drive, operate dangerous machinery, do any mentally demanding tasks or make any important legal or financial decisions while on narcotic pain medication and recovering from the general anesthetic.  ?  ?Call Your Doctor Immediately if You Have Any of the Following: ?Bleeding that you cannot control with the above measures ?Loss of vision, double vision, bulging of the eye or black eyes. ?Fever over 101 degrees ?Neck stiffness with severe headache,  fever, nausea and change in mental state. ?You are always encouraged to call anytime with concerns, however, please call with requests for pain medication refills during office hours. ? ?Office Endoscopy: [facility]

## 2021-07-03 ENCOUNTER — Ambulatory Visit: Payer: Medicare Other | Admitting: Anesthesiology

## 2021-07-03 ENCOUNTER — Other Ambulatory Visit: Payer: Self-pay

## 2021-07-03 ENCOUNTER — Encounter
Admission: RE | Disposition: A | Discharge status: Home / Self Care | Payer: Self-pay | Source: Home / Self Care | Attending: Otolaryngology

## 2021-07-03 ENCOUNTER — Ambulatory Visit
Admission: RE | Admit: 2021-07-03 | Discharge: 2021-07-03 | Disposition: A | Discharge status: Home / Self Care | Payer: Medicare Other | Attending: Otolaryngology | Admitting: Otolaryngology

## 2021-07-03 ENCOUNTER — Encounter: Payer: Self-pay | Admitting: Otolaryngology

## 2021-07-03 DIAGNOSIS — J342 Deviated nasal septum: Secondary | ICD-10-CM | POA: Diagnosis present

## 2021-07-03 DIAGNOSIS — J988 Other specified respiratory disorders: Secondary | ICD-10-CM | POA: Insufficient documentation

## 2021-07-03 HISTORY — PX: SEPTOPLASTY: SHX2393

## 2021-07-03 SURGERY — SEPTOPLASTY, NOSE
Anesthesia: General | Site: Nose

## 2021-07-03 MED ORDER — PHENYLEPHRINE HCL 0.5 % NA SOLN
NASAL | Status: DC | PRN
  Administered 2021-07-03: 15 mL via TOPICAL

## 2021-07-03 MED ORDER — TRIAMCINOLONE ACETONIDE 55 MCG/ACT NA AERO: 2.0000 | INHALATION_SPRAY | Freq: Every day | NASAL | 11 refills | Status: DC

## 2021-07-03 MED ORDER — LIDOCAINE HCL (CARDIAC) PF 100 MG/5ML IV SOSY
PREFILLED_SYRINGE | INTRAVENOUS | Status: DC | PRN
  Administered 2021-07-03: 50 mg via INTRAVENOUS

## 2021-07-03 MED ORDER — ONDANSETRON HCL 4 MG/2ML IJ SOLN
INTRAMUSCULAR | Status: DC | PRN
  Administered 2021-07-03: 4 mg via INTRAVENOUS

## 2021-07-03 MED ORDER — GLYCOPYRROLATE 0.2 MG/ML IJ SOLN
INTRAMUSCULAR | Status: DC | PRN
  Administered 2021-07-03: .1 mg via INTRAVENOUS

## 2021-07-03 MED ORDER — LIDOCAINE-EPINEPHRINE 1 %-1:100000 IJ SOLN
INTRAMUSCULAR | Status: DC | PRN
  Administered 2021-07-03: 6 mL

## 2021-07-03 MED ORDER — FENTANYL CITRATE (PF) 100 MCG/2ML IJ SOLN
INTRAMUSCULAR | Status: DC | PRN
  Administered 2021-07-03: 25 ug via INTRAVENOUS
  Administered 2021-07-03: 50 ug via INTRAVENOUS

## 2021-07-03 MED ORDER — SCOPOLAMINE 1 MG/3DAYS TD PT72
1.0000 | MEDICATED_PATCH | Freq: Once | TRANSDERMAL | Status: DC
  Administered 2021-07-03: 1.5 mg via TRANSDERMAL

## 2021-07-03 MED ORDER — DEXAMETHASONE SODIUM PHOSPHATE 4 MG/ML IJ SOLN
INTRAMUSCULAR | Status: DC | PRN
  Administered 2021-07-03: 8 mg via INTRAVENOUS

## 2021-07-03 MED ORDER — CEPHALEXIN 500 MG PO CAPS: 500.0000 mg | ORAL_CAPSULE | Freq: Two times a day (BID) | ORAL | 0 refills | Status: DC

## 2021-07-03 MED ORDER — DEXTROSE 5 % IV SOLN
2000.0000 mg | Freq: Once | INTRAVENOUS | Status: AC
Start: 2021-07-03 — End: 2021-07-03
  Administered 2021-07-03: 2000 mg via INTRAVENOUS

## 2021-07-03 MED ORDER — PROPOFOL 10 MG/ML IV BOLUS
INTRAVENOUS | Status: DC | PRN
  Administered 2021-07-03: 130 mg via INTRAVENOUS

## 2021-07-03 MED ORDER — FENTANYL CITRATE PF 50 MCG/ML IJ SOSY
25.0000 ug | PREFILLED_SYRINGE | INTRAMUSCULAR | Status: DC | PRN
  Administered 2021-07-03: 25 ug via INTRAVENOUS

## 2021-07-03 MED ORDER — MIDAZOLAM HCL 5 MG/5ML IJ SOLN
INTRAMUSCULAR | Status: DC | PRN
  Administered 2021-07-03: 2 mg via INTRAVENOUS

## 2021-07-03 MED ORDER — TRAMADOL HCL 50 MG PO TABS: ORAL_TABLET | ORAL | 0 refills | Status: DC

## 2021-07-03 MED ORDER — ACETAMINOPHEN 10 MG/ML IV SOLN
1000.0000 mg | Freq: Once | INTRAVENOUS | Status: AC
  Administered 2021-07-03: 1000 mg via INTRAVENOUS

## 2021-07-03 MED ORDER — OXYMETAZOLINE HCL 0.05 % NA SOLN
2.0000 | Freq: Once | NASAL | Status: AC
  Administered 2021-07-03: 2 via NASAL

## 2021-07-03 MED ORDER — PREDNISONE 10 MG PO TABS: ORAL_TABLET | ORAL | 0 refills | Status: DC

## 2021-07-03 MED ORDER — SUCCINYLCHOLINE CHLORIDE 200 MG/10ML IV SOSY
PREFILLED_SYRINGE | INTRAVENOUS | Status: DC | PRN
  Administered 2021-07-03: 80 mg via INTRAVENOUS

## 2021-07-03 MED ORDER — LACTATED RINGERS IV SOLN: INTRAVENOUS | Status: DC

## 2021-07-03 MED ORDER — TRAMADOL HCL 50 MG PO TABS
50.0000 mg | ORAL_TABLET | Freq: Once | ORAL | Status: AC
  Administered 2021-07-03: 50 mg via ORAL

## 2021-07-03 SURGICAL SUPPLY — 25 items
CANISTER SUCT 1200ML W/VALVE (MISCELLANEOUS) ×2 IMPLANT
COAGULATOR SUCT 8FR VV (MISCELLANEOUS) ×2 IMPLANT
ELECT REM PT RETURN 9FT ADLT (ELECTROSURGICAL) ×2
ELECTRODE REM PT RTRN 9FT ADLT (ELECTROSURGICAL) ×1 IMPLANT
GLOVE SURG GAMMEX PI TX LF 7.5 (GLOVE) ×4 IMPLANT
GOWN STRL REUS W/ TWL LRG LVL3 (GOWN DISPOSABLE) ×1 IMPLANT
GOWN STRL REUS W/TWL LRG LVL3 (GOWN DISPOSABLE) ×2
KIT TURNOVER KIT A (KITS) ×2 IMPLANT
NDL ANESTHESIA 27G X 3.5 (NEEDLE) ×1 IMPLANT
NDL HYPO 27GX1-1/4 (NEEDLE) ×1 IMPLANT
NEEDLE ANESTHESIA  27G X 3.5 (NEEDLE) ×1
NEEDLE ANESTHESIA 27G X 3.5 (NEEDLE) ×1 IMPLANT
NEEDLE HYPO 27GX1-1/4 (NEEDLE) ×2 IMPLANT
PACK ENT CUSTOM (PACKS) ×2 IMPLANT
PATTIES SURGICAL .5 X3 (DISPOSABLE) ×2 IMPLANT
SPLINT NASAL SEPTAL BLV .50 ST (MISCELLANEOUS) ×2 IMPLANT
STRAP BODY AND KNEE 60X3 (MISCELLANEOUS) ×2 IMPLANT
SUT CHROMIC 3-0 (SUTURE) ×2
SUT CHROMIC 3-0 KS 27XMFL CR (SUTURE) ×1
SUT ETHILON 3-0 KS 30 BLK (SUTURE) ×2 IMPLANT
SUT PLAIN GUT 4-0 (SUTURE) ×2 IMPLANT
SUTURE CHRMC 3-0 KS 27XMFL CR (SUTURE) IMPLANT
SYR 3ML LL SCALE MARK (SYRINGE) ×2 IMPLANT
TOWEL OR 17X26 4PK STRL BLUE (TOWEL DISPOSABLE) ×2 IMPLANT
WATER STERILE IRR 250ML POUR (IV SOLUTION) ×2 IMPLANT

## 2021-07-03 NOTE — Anesthesia Preprocedure Evaluation (Addendum)
Anesthesia Evaluation  ?Patient identified by MRN, date of birth, ID band ?Patient awake ? ? ? ?Reviewed: ?Allergy & Precautions, NPO status  ? ?Airway ?Mallampati: II ? ?TM Distance: >3 FB ? ? ? ? Dental ?  ?Pulmonary ?Current Smoker and Patient abstained from smoking.,  ?  ?Pulmonary exam normal ? ? ? ? ? ? ? Cardiovascular ?hypertension,  ?Rhythm:Regular Rate:Normal ? ? ?  ?Neuro/Psych ?PSYCHIATRIC DISORDERS Anxiety Depression   ? GI/Hepatic ?  ?Endo/Other  ?Obesity - BMI > 30 ? Renal/GU ?  ? ?  ?Musculoskeletal ? ?(+) Fibromyalgia -Osteogenesis imperfecta - mild type  ? Abdominal ?  ?Peds ? Hematology ?  ?Anesthesia Other Findings ? ? Reproductive/Obstetrics ? ?  ? ? ? ? ? ? ? ? ? ? ? ? ? ?  ?  ? ? ? ? ? ? ? ?Anesthesia Physical ?Anesthesia Plan ? ?ASA: 2 ? ?Anesthesia Plan: General  ? ?Post-op Pain Management:   ? ?Induction: Intravenous ? ?PONV Risk Score and Plan: Ondansetron, Dexamethasone, Midazolam and Treatment may vary due to age or medical condition ? ?Airway Management Planned: Oral ETT ? ?Additional Equipment:  ? ?Intra-op Plan:  ? ?Post-operative Plan:  ? ?Informed Consent: I have reviewed the patients History and Physical, chart, labs and discussed the procedure including the risks, benefits and alternatives for the proposed anesthesia with the patient or authorized representative who has indicated his/her understanding and acceptance.  ? ? ? ?Dental advisory given ? ?Plan Discussed with: CRNA ? ?Anesthesia Plan Comments:   ? ? ? ? ? ? ?Anesthesia Quick Evaluation ? ?

## 2021-07-03 NOTE — H&P (Signed)
H&P has been reviewed and patient reevaluated, no changes necessary. To be downloaded later.  

## 2021-07-03 NOTE — Op Note (Signed)
07/03/2021 ? ?8:35 AM ? ? ? ?Rebecca Farrell, Rebecca Farrell ? ?[id number] ? ? 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 ?Post-op Dx: Same ? ?Proc: Septoplasty revision ? ?Surg:  Cammy Copa ? ?Anes:  GOT ? ?EBL: 30 mL ? ?Comp: None ? ?Findings: She has had previous septoplasty where the anterior cartilaginous septum was straight and midline but then it bowed to the left significantly blocking the left airway from a deviated ethmoid plate and posterior vomer.  She has had previous trimming of her inferior turbinates where over half of the turbinate is missing on both sides.  She has also had some trimming of her right middle turbinate.  Her left middle turbinate is deviated way off to the left side blocking the middle meatus some. ? ? ?Procedure: The patient was brought to the operating room and placed in a supine position.  She was given general anesthesia by oral endotracheal intubation.  Once the patient was asleep the nose was prepped using 6 mL of 1% Xylocaine with epi 1-100,000 for vasoconstriction and anesthesia.  Cotton pledgets were placed in the nose with Neo-Synephrine and Xylocaine for further anesthesia and vasoconstriction.  These were allowed to sit while the patient was prepped and draped in sterile fashion. ? ?The cotton pledgets were then removed and a 0 degree scope was used for visualizing both sides of the nasal passage.  The findings were as described above.  The anterior septum was relatively straight but just pulled over to the left by the ethmoid plate.  A left Killian incision was created just in front of the anterior border of the ethmoid plate.  The mucosa was elevated over the ethmoid plate on the left side.  The mucosa was then elevated on the right side of the ethmoid plate.  This was markedly deviated to the left and pulling everything over.  The ethmoid plate was fractured at its upper portion to free it up and a portion of the ethmoid plate was removed.  Dissection  was followed inferiorly over both sides of the vomer and this was freed up.  It was deviated to the left side also pulling the septum posteriorly to the left.  Some of the vomer bone was then removed to allow the mucosal flaps to fall back towards the midline.  The 0 degree scope was used to visualize these areas and now the septum.  More straight up and down and the left nasal airway was much larger in the right nasal airway was more normal.  A 4-0 plain gut suture was used in a through and through whipstitch fashion to hold the mucosal flaps together posteriorly.  This closed the left Killian incision as well.  This closed all the dead space so that she could not develop a septal hematoma.  The 0 degree scope was used to revisualize the area in the septum was midline and the nasal passages appeared to be equal. ? ?Using the 0 degree scope the left middle turbinate was infractured to create more room in the middle meatus.  Xerogel was then placed into the middle meatus to help hold the middle turbinate medialized.  The airways were open and clear.  I decided not to use nasal splints since the cartilage was sitting in the midline where it belonged and there was no tension on the septum.  ? ?The patient tolerated the procedure well. She was awakened and taken to the recovery room in  satisfactory condition.There were no  complications. ? ?Dispo:   To PACU to be discharged home. ? ?Plan:  To follow up in the office in 5 days. She will flush with saline at home.  Will use tramadol for pain since allergic to codeine. Will rest at home with her head elevated for 4 days. ? ?Cammy Copa ? ?07/03/2021 ?8:35 AM  ?

## 2021-07-03 NOTE — Transfer of Care (Signed)
Immediate Anesthesia Transfer of Care Note ? ?Patient: Rebecca Farrell ? ?Procedure(s) Performed: SEPTOPLASTY (Nose) ? ?Patient Location: PACU ? ?Anesthesia Type: General ? ?Level of Consciousness: awake, alert  and patient cooperative ? ?Airway and Oxygen Therapy: Patient Spontanous Breathing and Patient connected to supplemental oxygen ? ?Post-op Assessment: Post-op Vital signs reviewed, Patient's Cardiovascular Status Stable, Respiratory Function Stable, Patent Airway and No signs of Nausea or vomiting ? ?Post-op Vital Signs: Reviewed and stable ? ?Complications: No notable events documented. ? ?

## 2021-07-03 NOTE — Anesthesia Postprocedure Evaluation (Signed)
Anesthesia Post Note ? ?Patient: Rebecca Farrell ? ?Procedure(s) Performed: SEPTOPLASTY (Nose) ? ? ?  ?Patient location during evaluation: PACU ?Anesthesia Type: General ?Level of consciousness: awake ?Pain management: pain level controlled ?Vital Signs Assessment: post-procedure vital signs reviewed and stable ?Respiratory status: respiratory function stable ?Cardiovascular status: stable ?Postop Assessment: no signs of nausea or vomiting ?Anesthetic complications: no ? ? ?No notable events documented. ? ?Jola Babinski ? ? ? ? ? ?

## 2021-07-03 NOTE — Anesthesia Procedure Notes (Signed)
Procedure Name: Intubation ?Date/Time: 07/03/2021 7:43 AM ?Performed by: Jimmy Picket, CRNA ?Pre-anesthesia Checklist: Patient identified, Emergency Drugs available, Suction available, Patient being monitored and Timeout performed ?Patient Re-evaluated:Patient Re-evaluated prior to induction ?Oxygen Delivery Method: Circle system utilized ?Preoxygenation: Pre-oxygenation with 100% oxygen ?Induction Type: IV induction ?Ventilation: Mask ventilation without difficulty ?Laryngoscope Size: Hyacinth Meeker and 2 ?Grade View: Grade I ?Tube type: Oral Sheilah Pigeon ?Tube size: 6.5 mm ?Number of attempts: 1 ?Placement Confirmation: ETT inserted through vocal cords under direct vision, positive ETCO2 and breath sounds checked- equal and bilateral ?Tube secured with: Tape ?Dental Injury: Teeth and Oropharynx as per pre-operative assessment  ? ? ? ? ?

## 2021-07-07 ENCOUNTER — Encounter: Payer: Self-pay | Admitting: Otolaryngology

## 2021-08-18 ENCOUNTER — Ambulatory Visit: Payer: Medicare Other

## 2021-09-18 ENCOUNTER — Encounter: Payer: Self-pay | Admitting: Emergency Medicine

## 2021-09-18 ENCOUNTER — Ambulatory Visit
Admission: EM | Admit: 2021-09-18 | Discharge: 2021-09-18 | Disposition: A | Discharge status: Home / Self Care | Payer: Medicare Other | Attending: Emergency Medicine | Admitting: Emergency Medicine

## 2021-09-18 ENCOUNTER — Other Ambulatory Visit: Payer: Self-pay

## 2021-09-18 DIAGNOSIS — S29011A Strain of muscle and tendon of front wall of thorax, initial encounter: Secondary | ICD-10-CM

## 2021-09-18 DIAGNOSIS — R052 Subacute cough: Secondary | ICD-10-CM | POA: Diagnosis not present

## 2021-09-18 DIAGNOSIS — J069 Acute upper respiratory infection, unspecified: Secondary | ICD-10-CM

## 2021-09-18 MED ORDER — IPRATROPIUM BROMIDE 0.06 % NA SOLN: 2.0000 | Freq: Four times a day (QID) | NASAL | 12 refills | Status: AC

## 2021-09-18 MED ORDER — AMOXICILLIN-POT CLAVULANATE 875-125 MG PO TABS: 1.0000 | ORAL_TABLET | Freq: Two times a day (BID) | ORAL | 0 refills | Status: AC

## 2021-09-18 MED ORDER — BENZONATATE 100 MG PO CAPS: 200.0000 mg | ORAL_CAPSULE | Freq: Three times a day (TID) | ORAL | 0 refills | Status: AC

## 2021-09-18 MED ORDER — PROMETHAZINE-DM 6.25-15 MG/5ML PO SYRP: 5.0000 mL | ORAL_SOLUTION | Freq: Four times a day (QID) | ORAL | 0 refills | Status: AC | PRN

## 2021-09-18 NOTE — ED Provider Notes (Signed)
[facility]    CSN: [id number] Arrival date & time: 09/18/21  1044      History   Chief Complaint Chief Complaint  Patient presents with   Cough    HPI Rebecca Farrell is a 41 y.o. female.   HPI  27 old female here for evaluation respiratory complaints.  Patient reports that she has been experiencing a cough that is proctored for green sputum for the last 8 weeks.  This is also associated with runny nose, nasal congestion, green nasal discharge, left ear pressure, and decreased hearing.  She denies any fever, sore throat, shortness of breath, or wheezing.  She states that she developed pain in her right ribs 2 days ago as result of coughing.  Patient was evaluated by her primary care provider and was prescribed Tussionex for her cough.  The patient is using that but no relief of symptoms.  She did have a chest x-ray performed on 09/09/2021 that did not show any signs of pneumonia.  Past Medical History:  Diagnosis Date   Anxiety    Depression    Disorder of skeletal muscle    Fibromyalgia    Hypertension    Osteogenesis imperfecta    PTSD (post-traumatic stress disorder)    Scoliosis     Patient Active Problem List   Diagnosis Date Noted   History of hysterectomy, supracervical 01/30/2017    Past Surgical History:  Procedure Laterality Date   ABDOMINAL HYSTERECTOMY  02/2010   partial; has ovaries and cervix   CESAREAN SECTION  x2   CHOLECYSTECTOMY     KNEE SURGERY Bilateral    arthroscopic   NASAL SINUS SURGERY     NOSE SURGERY     SEPTOPLASTY N/A 07/03/2021   Procedure: SEPTOPLASTY;  Surgeon: Vernie Murders, MD;  Location: [facility];  Service: ENT;  Laterality: N/A;    OB History     Gravida  4   Para  2   Term  2   Preterm      AB  2   Living  2      SAB      IAB  2   Ectopic      Multiple      Live Births  2            Home Medications    Prior to Admission medications   Medication Sig Start Date End Date  Taking? Authorizing Provider  amoxicillin-clavulanate (AUGMENTIN) 875-125 MG tablet Take 1 tablet by mouth every 12 (twelve) hours for 10 days. 09/18/21 09/28/21 Yes Becky Augusta, NP  benzonatate (TESSALON) 100 MG capsule Take 2 capsules (200 mg total) by mouth every 8 (eight) hours. 09/18/21  Yes Becky Augusta, NP  EPINEPHrine 0.3 mg/0.3 mL IJ SOAJ injection INJECT INTRAMUSCULARLY AS DIRECTED 02/17/17  Yes [provider]  ipratropium (ATROVENT) 0.06 % nasal spray Place 2 sprays into both nostrils 4 (four) times daily. 09/18/21  Yes Becky Augusta, NP  lisinopril-hydrochlorothiazide (PRINZIDE,ZESTORETIC) 10-12.5 MG tablet Take by mouth. 04/07/16 09/18/21 Yes [provider]  omeprazole (PRILOSEC) 20 MG capsule Take 20 mg by mouth. 07/06/16  Yes [provider]  promethazine-dextromethorphan (PROMETHAZINE-DM) 6.25-15 MG/5ML syrup Take 5 mLs by mouth 4 (four) times daily as needed. 09/18/21  Yes Becky Augusta, NP  DULoxetine (CYMBALTA) 30 MG capsule Take by mouth. 06/10/16   [provider]    Family History Family History  Problem Relation Age of Onset   Other Father  bone disease   Endometriosis Maternal Aunt    Other Son        bone disease   Other Daughter        bone disease    Social History Social History   Tobacco Use   Smoking status: Former    Types: Cigarettes   Smokeless tobacco: Never  Vaping Use   Vaping Use: Never used  Substance Use Topics   Alcohol use: No   Drug use: No     Allergies   Flagyl [metronidazole] and Oxycodone-acetaminophen   Review of Systems Review of Systems  Constitutional:  Negative for fever.  HENT:  Positive for congestion, ear pain, hearing loss and rhinorrhea. Negative for sore throat.   Respiratory:  Positive for cough. Negative for shortness of breath and wheezing.   Cardiovascular:        Right rib pain.     Physical Exam Triage Vital Signs ED Triage Vitals  Enc Vitals Group     BP      Pulse       Resp      Temp      Temp src      SpO2      Weight      Height      Head Circumference      Peak Flow      Pain Score      Pain Loc      Pain Edu?      Excl. in GC?    No data found.  Updated Vital Signs BP (!) 149/110 (BP Location: Right Arm)   Pulse (!) 113   Temp 98.8 F (37.1 C) (Oral)   Resp 18   Ht 4\' 11"  (1.499 m)   Wt 154 lb 1.6 oz (69.9 kg)   SpO2 97%   BMI 31.12 kg/m   Visual Acuity Right Eye Distance:   Left Eye Distance:   Bilateral Distance:    Right Eye Near:   Left Eye Near:    Bilateral Near:     Physical Exam Vitals and nursing note reviewed.  Constitutional:      Appearance: Normal appearance. She is not ill-appearing.  HENT:     Head: Normocephalic and atraumatic.     Right Ear: Tympanic membrane, ear canal and external ear normal. There is no impacted cerumen.     Left Ear: Tympanic membrane, ear canal and external ear normal. There is no impacted cerumen.     Nose: Congestion and rhinorrhea present.     Mouth/Throat:     Mouth: Mucous membranes are moist.     Pharynx: Oropharynx is clear. No posterior oropharyngeal erythema.  Cardiovascular:     Rate and Rhythm: Normal rate.     Pulses: Normal pulses.     Heart sounds: Normal heart sounds. No murmur heard.    No friction rub. No gallop.  Pulmonary:     Effort: Pulmonary effort is normal.     Breath sounds: Normal breath sounds. No wheezing, rhonchi or rales.  Musculoskeletal:     Cervical back: Normal range of motion and neck supple.  Lymphadenopathy:     Cervical: No cervical adenopathy.  Skin:    General: Skin is warm and dry.     Capillary Refill: Capillary refill takes less than 2 seconds.     Findings: No erythema or rash.  Neurological:     General: No focal deficit present.     Mental Status: She is alert and  oriented to person, place, and time.  Psychiatric:        Mood and Affect: Mood normal.        Behavior: Behavior normal.        Thought Content: Thought content  normal.        Judgment: Judgment normal.      UC Treatments / Results  Labs (all labs ordered are listed, but only abnormal results are displayed) Labs Reviewed - No data to display  EKG   Radiology No results found.  Procedures Procedures (including critical care time)  Medications Ordered in UC Medications - No data to display  Initial Impression / Assessment and Plan / UC Course  I have reviewed the triage vital signs and the nursing notes.  Pertinent labs & imaging results that were available during my care of the patient were reviewed by me and considered in my medical decision making (see chart for details).  Patient is a pleasant, nontoxic-appearing 41 year old female here for evaluation of ongoing respiratory symptoms that been present for last 2 weeks to include runny nose, nasal congestion, green nasal discharge, and green sputum production when coughing.  Also left ear pressure and decreased hearing.  No fever, sore throat, shortness breath, or wheezing.  She also developed pain in her right ribs 2 days ago as result of coughing.  She has been bracing herself with her right arm which helps.  Physical exam reveals pearly-gray tympanic membranes bilaterally with normal light reflex and clear external auditory canals.  Nasal mucosa is erythematous and edematous with yellow discharge in both nares.  No tenderness to percussion of frontal or maxillary sinuses bilaterally.  Oropharyngeal exam is benign.  No cervical lymphadenopathy appreciable exam.  Cardiopulmonary exam reveals S1-S2 heart sounds with regular rate and rhythm and lung sounds that are clear to auscultation all fields.  Patient does have tenderness when palpating the ninth and 10th rib on the right side underneath her breast.  There is no crepitus appreciated on exam.  I suspect that the patient's cough is being driven by her upper respiratory symptoms and postnasal drip.  I also believe that her rib pain is secondary  to chest wall strain as a result of her coughing.  Given the duration of symptoms I feel a trial of antibiotics is warranted and I will do a trial of Augmentin twice daily for 10 days with food.  I will also prescribe Atrovent nasal spray to help with the nasal congestion and postnasal drip which I believe is feeding the cough.  I will prescribe Tessalon Perles that she can use for cough to the day and Promethazine DM cough syrup that she can use at bedtime.  Return precautions reviewed   Final Clinical Impressions(s) / UC Diagnoses   Final diagnoses:  Upper respiratory tract infection, unspecified type  Subacute cough  Chest wall muscle strain, initial encounter     Discharge Instructions      Take the Augmentin twice daily for 10 days with food for treatment of your URI.  Use OTC Tylenol and Ibuprofen as needed for your rub pain.  Use the Atrovent nasal spray, 2 squirts in each nostril every 6 hours, as needed for runny nose and postnasal drip.  Use the Tessalon Perles every 8 hours during the day.  Take them with a small sip of water.  They may give you some numbness to the base of your tongue or a metallic taste in your mouth, this is normal.  Use the  Promethazine DM cough syrup at bedtime for cough and congestion.  It will make you drowsy so do not take it during the day.  Return for reevaluation or see your primary care provider for any new or worsening symptoms.      ED Prescriptions     Medication Sig Dispense Auth. Provider   amoxicillin-clavulanate (AUGMENTIN) 875-125 MG tablet Take 1 tablet by mouth every 12 (twelve) hours for 10 days. 20 tablet Becky Augusta, NP   benzonatate (TESSALON) 100 MG capsule Take 2 capsules (200 mg total) by mouth every 8 (eight) hours. 21 capsule Becky Augusta, NP   ipratropium (ATROVENT) 0.06 % nasal spray Place 2 sprays into both nostrils 4 (four) times daily. 15 mL Becky Augusta, NP   promethazine-dextromethorphan (PROMETHAZINE-DM) 6.25-15  MG/5ML syrup Take 5 mLs by mouth 4 (four) times daily as needed. 118 mL Becky Augusta, NP      PDMP not reviewed this encounter.   Becky Augusta, NP 09/18/21 1134

## 2021-09-18 NOTE — Discharge Instructions (Signed)
Take the Augmentin twice daily for 10 days with food for treatment of your URI.  Use OTC Tylenol and Ibuprofen as needed for your rub pain.  Use the Atrovent nasal spray, 2 squirts in each nostril every 6 hours, as needed for runny nose and postnasal drip.  Use the Tessalon Perles every 8 hours during the day.  Take them with a small sip of water.  They may give you some numbness to the base of your tongue or a metallic taste in your mouth, this is normal.  Use the Promethazine DM cough syrup at bedtime for cough and congestion.  It will make you drowsy so do not take it during the day.  Return for reevaluation or see your primary care provider for any new or worsening symptoms.

## 2021-09-18 NOTE — ED Triage Notes (Signed)
Pt c/o cough. Started about 8 weeks ago. She has seen her pcp and treated. She states she started having right sided rib pain about 2 days ago.

## 2022-01-29 ENCOUNTER — Other Ambulatory Visit: Payer: Self-pay | Admitting: Family

## 2022-01-29 DIAGNOSIS — Z1231 Encounter for screening mammogram for malignant neoplasm of breast: Secondary | ICD-10-CM

## 2022-09-16 ENCOUNTER — Ambulatory Visit: Payer: Medicare Other

## 2022-09-16 DIAGNOSIS — Z23 Encounter for immunization: Secondary | ICD-10-CM

## 2022-09-16 DIAGNOSIS — Z719 Counseling, unspecified: Secondary | ICD-10-CM

## 2022-09-16 NOTE — Progress Notes (Signed)
Pt seen in nurse clinic for Tdap vaccine. Pt states I need to get tdap shot so I can be around my preemie grand baby. Eligible, given Tdap to left deltoid, tolerated well. Given VIS and NCIR copy, explained and understood. M.Breahna Boylen, LPN.

## 2023-04-24 ENCOUNTER — Ambulatory Visit
Admission: EM | Admit: 2023-04-24 | Discharge: 2023-04-24 | Disposition: A | Discharge status: Home / Self Care | Payer: Medicare Other | Attending: Family Medicine | Admitting: Family Medicine

## 2023-04-24 ENCOUNTER — Encounter: Payer: Self-pay | Admitting: Emergency Medicine

## 2023-04-24 DIAGNOSIS — I160 Hypertensive urgency: Secondary | ICD-10-CM

## 2023-04-24 DIAGNOSIS — R202 Paresthesia of skin: Secondary | ICD-10-CM | POA: Diagnosis not present

## 2023-04-24 DIAGNOSIS — R519 Headache, unspecified: Secondary | ICD-10-CM

## 2023-04-24 DIAGNOSIS — R200 Anesthesia of skin: Secondary | ICD-10-CM

## 2023-04-24 NOTE — Discharge Instructions (Signed)

## 2023-04-24 NOTE — ED Notes (Signed)
 Patient is being discharged from the Urgent Care and sent to the [facility] Emergency Department via private vehicle with her boyfriend . Per Dr. Kriste, patient is in need of higher level of care due to stroke symptoms and chest pain and hypertension. Patient is aware and verbalizes understanding of plan of care.  Vitals:   04/24/23 1222  BP: (!) 223/134  Pulse: 97  Temp: 98.4 F (36.9 C)  SpO2: 100%

## 2023-04-24 NOTE — ED Provider Notes (Signed)
 [facility]    CSN: [id number] Arrival date & time: 04/24/23  1213      History   Chief Complaint Chief Complaint  Patient presents with   Hypertension   Numbness    HPI Rebecca Farrell is a 43 y.o. female.   HPI   Rebecca Farrell presents for headache, right sided numbness and elevated blood pressure.   She is not able to feel inside her mouth, flutters in her vision, chest pain, shortness of breath, right sided numbness  that 6 days ago and is getting worse. The headache started yesterday around lunch time. That's when she called her primary care doctor.  The doctor called her yesterday and told her she needed to go the ED immediately.  Her family suggested that she come to the urgent care today.  She wants to go to her 26 year old grandma's birthday party and does not have time to wait in the emergency department.  She has been having problems with her blood pressure for a while. She has a 55 yo son that has been stressing her out and has been taking up the load for her daughter and grandkids.   Has been breaking out in sweats, nausea, diarrhea but no vomiting.  No fever.     Past Medical History:  Diagnosis Date   Anxiety    Depression    Disorder of skeletal muscle    Fibromyalgia    Hypertension    Osteogenesis imperfecta    PTSD (post-traumatic stress disorder)    Scoliosis     Patient Active Problem List   Diagnosis Date Noted   History of hysterectomy, supracervical 01/30/2017    Past Surgical History:  Procedure Laterality Date   ABDOMINAL HYSTERECTOMY  02/2010   partial; has ovaries and cervix   CESAREAN SECTION  x2   CHOLECYSTECTOMY     KNEE SURGERY Bilateral    arthroscopic   NASAL SINUS SURGERY     NOSE SURGERY     SEPTOPLASTY N/A 07/03/2021   Procedure: SEPTOPLASTY;  Surgeon: Edda Mt, MD;  Location: [facility];  Service: ENT;  Laterality: N/A;    OB History     Gravida  4   Para  2   Term  2   Preterm      AB   2   Living  2      SAB      IAB  2   Ectopic      Multiple      Live Births  2            Home Medications    Prior to Admission medications   Medication Sig Start Date End Date Taking? Authorizing Provider  lisinopril-hydrochlorothiazide (PRINZIDE,ZESTORETIC) 10-12.5 MG tablet Take by mouth. 04/07/16 04/24/23 Yes [provider]  benzonatate  (TESSALON ) 100 MG capsule Take 2 capsules (200 mg total) by mouth every 8 (eight) hours. 09/18/21   Bernardino Ditch, NP  DULoxetine (CYMBALTA) 30 MG capsule Take by mouth. 06/10/16   [provider]  EPINEPHrine  0.3 mg/0.3 mL IJ SOAJ injection INJECT INTRAMUSCULARLY AS DIRECTED 02/17/17   [provider]  ipratropium (ATROVENT ) 0.06 % nasal spray Place 2 sprays into both nostrils 4 (four) times daily. 09/18/21   Bernardino Ditch, NP  omeprazole (PRILOSEC) 20 MG capsule Take 20 mg by mouth. 07/06/16   [provider]  promethazine -dextromethorphan (PROMETHAZINE -DM) 6.25-15 MG/5ML syrup Take 5 mLs by mouth 4 (four) times daily as needed. 09/18/21   Bernardino Ditch,  NP    Family History Family History  Problem Relation Age of Onset   Other Father        bone disease   Endometriosis Maternal Aunt    Other Son        bone disease   Other Daughter        bone disease    Social History Social History   Tobacco Use   Smoking status: Former    Types: Cigarettes   Smokeless tobacco: Never  Vaping Use   Vaping status: Never Used  Substance Use Topics   Alcohol use: No   Drug use: No     Allergies   Flagyl  [metronidazole ] and Oxycodone-acetaminophen    Review of Systems Review of Systems: negative unless otherwise stated in HPI.      Physical Exam Triage Vital Signs ED Triage Vitals  Encounter Vitals Group     BP 04/24/23 1222 (!) 223/134     Systolic BP Percentile --      Diastolic BP Percentile --      Pulse Rate 04/24/23 1222 97     Resp --      Temp 04/24/23 1222 98.4 F (36.9 C)     Temp  Source 04/24/23 1222 Oral     SpO2 04/24/23 1222 100 %     Weight 04/24/23 1221 161 lb (73 kg)     Height 04/24/23 1221 4' 11 (1.499 m)     Head Circumference --      Peak Flow --      Pain Score 04/24/23 1221 4     Pain Loc --      Pain Education --      Exclude from Growth Chart --    No data found.  Updated Vital Signs BP (!) 223/134 (BP Location: Left Arm) Comment: Patient has not taken BP medicine today  Pulse 97   Temp 98.4 F (36.9 C) (Oral)   Ht 4' 11 (1.499 m)   Wt 73 kg   SpO2 100%   BMI 32.52 kg/m   Visual Acuity Right Eye Distance:   Left Eye Distance:   Bilateral Distance:    Right Eye Near:   Left Eye Near:    Bilateral Near:     Physical Exam GEN:     alert, non-toxic appearing female  HENT:  mucus membranes moist, oropharyngeal without lesions or erythema,  nares patent, no nasal discharge, uvula midline, normal tongue alignment  EYES:   pupils equal and reactive, EOM intact NECK:  supple, normal ROM RESP:  clear to auscultation bilaterally, no increased work of breathing  CVS:   regular rate and rhythm, no murmur, distal pulses intact   EXT:   Thin extremities, good ROM, no edema  NEURO:  alert, oriented, speech normal, CN 2-12 grossly intact, no facial droop,  sensation grossly intact, strength 5/5 bilateral UE and LE, normal coordination,  Skin:   warm and dry     UC Treatments / Results  Labs (all labs ordered are listed, but only abnormal results are displayed) Labs Reviewed - No data to display  EKG  If EKG performed, see my interpretation in the MDM section  Radiology No results found.   Procedures Procedures (including critical care time)  Medications Ordered in UC Medications - No data to display  Initial Impression / Assessment and Plan / UC Course  I have reviewed the triage vital signs and the nursing notes.  Pertinent labs & imaging results that were available  during my care of the patient were reviewed by me and  considered in my medical decision making (see chart for details).       Patient is a 43 y.o. female  who presents for headache, extremity numbness on the right, elevated blood pressure.  Overall patient is nontoxic-appearing and afebrile.  Satting well on room air.  Her blood pressure is significantly elevated here at 223/104.  EKG obtained showing normal sinus rhythm without acute ST or T wave changes however she does have some changes in her anterior leads that are new compared to her EKG from 11/06/2010.  Her cardiopulmonary and neurological exams are grossly unremarkable.  She requested to update her partner at the bedside.  Given her symptoms I am concerned that she needs a cardiac and neurological workup.  I told her not to delay despite her partner wanting to take her to get food before going to the emergency department.  She will travel by private vehicle to [facility] ED.  ED and return precautions given and patient/guardian voiced understanding. Discussed MDM, treatment plan and plan for follow-up with patient who agrees with plan.     Final Clinical Impressions(s) / UC Diagnoses   Final diagnoses:  Hypertensive urgency  Paresthesia  Extremity numbness  Acute nonintractable headache, unspecified headache type     Discharge Instructions      You have been advised to follow up immediately in the emergency department for concerning signs or symptoms as discussed during your visit. If you declined EMS transport, please have a family member take you directly to the ED at this time. Do not delay.   Based on concerns about condition, if you do not follow up in the ED, you may risk poor outcomes including worsening of condition, delayed treatment and potentially life threatening issues. If you have declined to go to the ED at this time, you should call your PCP immediately to set up a follow up appointment.   Go to ED for red flag symptoms, including; fevers you cannot reduce with  Tylenol /Motrin, severe headaches, vision changes, numbness/weakness in part of the body, lethargy, confusion, intractable vomiting, severe dehydration, chest pain, breathing difficulty, severe persistent abdominal or pelvic pain, signs of severe infection (increased redness, swelling of an area), feeling faint or passing out, dizziness, etc. You should especially go to the ED for sudden acute worsening of condition if you do not elect to go at this time.      ED Prescriptions   None    PDMP not reviewed this encounter.   Durrell Barajas, DO 04/24/23 1537

## 2023-04-24 NOTE — ED Triage Notes (Signed)
 Patient c/o right sided numbness off and on for 6 days.  Patient reports ongoing headaches.  Patient reports chest pain and SOB.
